# Patient Record
Sex: Female | Born: 1985 | Race: White | Hispanic: No | Marital: Single | Smoking: Current every day smoker
Health system: Southern US, Community
[De-identification: ages and names within clinical notes are randomized; demographics above are authoritative.]

## PROBLEM LIST (undated history)

## (undated) DIAGNOSIS — I10 Essential (primary) hypertension: Secondary | ICD-10-CM

---

## 2011-03-23 ENCOUNTER — Emergency Department (INDEPENDENT_AMBULATORY_CARE_PROVIDER_SITE_OTHER)
Admission: EM | Admit: 2011-03-23 | Discharge: 2011-03-23 | Disposition: A | Discharge status: Home / Self Care | Payer: Self-pay | Source: Home / Self Care | Attending: Family Medicine | Admitting: Family Medicine

## 2011-03-23 DIAGNOSIS — R030 Elevated blood-pressure reading, without diagnosis of hypertension: Secondary | ICD-10-CM

## 2011-03-23 MED ORDER — HYDROCHLOROTHIAZIDE 25 MG PO TABS: 25.0000 mg | ORAL_TABLET | Freq: Every day | ORAL | Status: DC

## 2011-03-23 NOTE — ED Provider Notes (Signed)
History     CSN: [id number]  Arrival date & time 03/23/11  1325   First MD Initiated Contact with Patient 03/23/11 1459      Chief Complaint  Patient presents with  . Hypertension    (Consider location/radiation/quality/duration/timing/severity/associated sxs/prior treatment) HPI Comments: Beverly Greene presents for evaluation of elevated blood pressures, taken at local pharmacies. She reports concern about her health since her mother just died in 10/30/22 from heart disease. She also reports a long hx of anxiety, without a formal diagnosis. She smokes a pack per day of cigarettes, drinks alcohol occasionally. She reports that she has changed her diet. She expresses significant anxiety and stress over her mother's death, her dad is now with end-stage renal disease, and she recently lost her job at [employer].   Patient is a 26 y.o. female presenting with hypertension. The history is provided by the patient.  Hypertension This is a new problem. The problem has not changed since onset.Pertinent negatives include no chest pain and no headaches. The symptoms are aggravated by nothing. The symptoms are relieved by nothing.    Past Medical History  Diagnosis Date  . Asthma     History reviewed. No pertinent past surgical history.  History reviewed. No pertinent family history.  History  Substance Use Topics  . Smoking status: Current Everyday Smoker  . Smokeless tobacco: Not on file  . Alcohol Use: Yes    OB History    Grav Para Term Preterm Abortions TAB SAB Ect Mult Living                  Review of Systems  Constitutional: Negative.   HENT: Negative.   Eyes: Negative.   Respiratory: Negative.   Cardiovascular: Negative.  Negative for chest pain.  Gastrointestinal: Negative.   Genitourinary: Negative.   Musculoskeletal: Negative.   Skin: Negative.   Neurological: Negative.  Negative for headaches.    Allergies  Review of patient's allergies indicates no known  allergies.  Home Medications   Current Outpatient Rx  Name Route Sig Dispense Refill  . HYDROCHLOROTHIAZIDE 25 MG PO TABS Oral Take 1 tablet (25 mg total) by mouth daily. 30 tablet 1    BP 156/108  Pulse 120  Temp(Src) 98 F (36.7 C) (Oral)  Resp 22  SpO2 100%  LMP 03/03/2011  Physical Exam  Nursing note and vitals reviewed. Constitutional: She is oriented to person, place, and time. She appears well-developed and well-nourished.  HENT:  Head: Normocephalic and atraumatic.  Eyes: EOM are normal. Pupils are equal, round, and reactive to light.  Neck: Normal range of motion.  Cardiovascular: Normal rate, regular rhythm and normal heart sounds.   No murmur heard. Pulmonary/Chest: Effort normal and breath sounds normal.  Musculoskeletal: Normal range of motion.  Neurological: She is alert and oriented to person, place, and time.  Skin: Skin is warm and dry.  Psychiatric: Her behavior is normal.    ED Course  Procedures (including critical care time)  Labs Reviewed - No data to display No results found.   1. Elevated blood pressure reading without diagnosis of hypertension       MDM  Started on HCTZ; follow up in one week        Richardo Priest, MD 03/26/11 1157

## 2011-03-23 NOTE — ED Notes (Signed)
Reportedly has had elevated BP for some time; attempted to regulate by way of diet and garlic

## 2012-07-31 ENCOUNTER — Encounter: Payer: Self-pay | Admitting: Family Medicine

## 2012-07-31 ENCOUNTER — Ambulatory Visit: Payer: Self-pay | Attending: Family Medicine | Admitting: Family Medicine

## 2012-07-31 VITALS — BP 137/85 | HR 66 | Temp 98.2°F | Ht 63.0 in | Wt 178.0 lb

## 2012-07-31 DIAGNOSIS — J45909 Unspecified asthma, uncomplicated: Secondary | ICD-10-CM | POA: Insufficient documentation

## 2012-07-31 DIAGNOSIS — I10 Essential (primary) hypertension: Secondary | ICD-10-CM | POA: Insufficient documentation

## 2012-07-31 DIAGNOSIS — F17200 Nicotine dependence, unspecified, uncomplicated: Secondary | ICD-10-CM | POA: Insufficient documentation

## 2012-07-31 LAB — LIPID PANEL
Cholesterol: 136 mg/dL (ref 0–200)
HDL: 37 mg/dL — ABNORMAL LOW (ref >39)
Total CHOL/HDL Ratio: 3.7 Ratio
Triglycerides: 346 mg/dL — ABNORMAL HIGH (ref <150)
VLDL: 69 mg/dL — ABNORMAL HIGH (ref 0–40)

## 2012-07-31 LAB — COMPREHENSIVE METABOLIC PANEL
Alkaline Phosphatase: 42 U/L (ref 39–117)
CO2: 27 mEq/L (ref 19–32)
Creat: 0.68 mg/dL (ref 0.50–1.10)
Glucose, Bld: 143 mg/dL — ABNORMAL HIGH (ref 70–99)
Total Bilirubin: 0.4 mg/dL (ref 0.3–1.2)

## 2012-07-31 LAB — CBC
Hemoglobin: 14.1 g/dL (ref 12.0–15.0)
MCH: 30.8 pg (ref 26.0–34.0)
MCHC: 34.7 g/dL (ref 30.0–36.0)
MCV: 88.6 fL (ref 78.0–100.0)
RBC: 4.58 MIL/uL (ref 3.87–5.11)

## 2012-07-31 NOTE — Patient Instructions (Addendum)
Smoking Cessation, Tips for Success YOU CAN QUIT SMOKING If you are ready to quit smoking, congratulations! You have chosen to help yourself be healthier. Cigarettes bring nicotine, tar, carbon monoxide, and other irritants into your body. Your lungs, heart, and blood vessels will be able to work better without these poisons. There are many different ways to quit smoking. Nicotine gum, nicotine patches, a nicotine inhaler, or nicotine nasal spray can help with physical craving. Hypnosis, support groups, and medicines help break the habit of smoking. Here are some tips to help you quit for good.  Throw away all cigarettes.  Clean and remove all ashtrays from your home, work, and car.  On a card, write down your reasons for quitting. Carry the card with you and read it when you get the urge to smoke.  Cleanse your body of nicotine. Drink enough water and fluids to keep your urine clear or pale yellow. Do this after quitting to flush the nicotine from your body.  Learn to predict your moods. Do not let a bad situation be your excuse to have a cigarette. Some situations in your life might tempt you into wanting a cigarette.  Never have "just one" cigarette. It leads to wanting another and another. Remind yourself of your decision to quit.  Change habits associated with smoking. If you smoked while driving or when feeling stressed, try other activities to replace smoking. Stand up when drinking your coffee. Brush your teeth after eating. Sit in a different chair when you read the paper. Avoid alcohol while trying to quit, and try to drink fewer caffeinated beverages. Alcohol and caffeine may urge you to smoke.  Avoid foods and drinks that can trigger a desire to smoke, such as sugary or spicy foods and alcohol.  Ask people who smoke not to smoke around you.  Have something planned to do right after eating or having a cup of coffee. Take a walk or exercise to perk you up. This will help to keep you  from overeating.  Try a relaxation exercise to calm you down and decrease your stress. Remember, you may be tense and nervous for the first 2 weeks after you quit, but this will pass.  Find new activities to keep your hands busy. Play with a pen, coin, or rubber band. Doodle or draw things on paper.  Brush your teeth right after eating. This will help cut down on the craving for the taste of tobacco after meals. You can try mouthwash, too.  Use oral substitutes, such as lemon drops, carrots, a cinnamon stick, or chewing gum, in place of cigarettes. Keep them handy so they are available when you have the urge to smoke.  When you have the urge to smoke, try deep breathing.  Designate your home as a nonsmoking area.  If you are a heavy smoker, ask your caregiver about a prescription for nicotine chewing gum. It can ease your withdrawal from nicotine.  Reward yourself. Set aside the cigarette money you save and buy yourself something nice.  Look for support from others. Join a support group or smoking cessation program. Ask someone at home or at work to help you with your plan to quit smoking.  Always ask yourself, "Do I need this cigarette or is this just a reflex?" Tell yourself, "Today, I choose not to smoke," or "I do not want to smoke." You are reminding yourself of your decision to quit, even if you do smoke a cigarette. HOW WILL I FEEL WHEN   I QUIT SMOKING?  The benefits of not smoking start within days of quitting.  You may have symptoms of withdrawal because your body is used to nicotine (the addictive substance in cigarettes). You may crave cigarettes, be irritable, feel very hungry, cough often, get headaches, or have difficulty concentrating.  The withdrawal symptoms are only temporary. They are strongest when you first quit but will go away within 10 to 14 days.  When withdrawal symptoms occur, stay in control. Think about your reasons for quitting. Remind yourself that these are  signs that your body is healing and getting used to being without cigarettes.  Remember that withdrawal symptoms are easier to treat than the major diseases that smoking can cause.  Even after the withdrawal is over, expect periodic urges to smoke. However, these cravings are generally short-lived and will go away whether you smoke or not. Do not smoke!  If you relapse and smoke again, do not lose hope. Most smokers quit 3 times before they are successful.  If you relapse, do not give up! Plan ahead and think about what you will do the next time you get the urge to smoke. LIFE AS A NONSMOKER: MAKE IT FOR A MONTH, MAKE IT FOR LIFE Day 1: Hang this page where you will see it every day. Day 2: Get rid of all ashtrays, matches, and lighters. Day 3: Drink water. Breathe deeply between sips. Day 4: Avoid places with smoke-filled air, such as bars, clubs, or the smoking section of restaurants. Day 5: Keep track of how much money you save by not smoking. Day 6: Avoid boredom. Keep a good book with you or go to the movies. Day 7: Reward yourself! One week without smoking! Day 8: Make a dental appointment to get your teeth cleaned. Day 9: Decide how you will turn down a cigarette before it is offered to you. Day 10: Review your reasons for quitting. Day 11: Distract yourself. Stay active to keep your mind off smoking and to relieve tension. Take a walk, exercise, read a book, do a crossword puzzle, or try a new hobby. Day 12: Exercise. Get off the bus before your stop or use stairs instead of escalators. Day 13: Call on friends for support and encouragement. Day 14: Reward yourself! Two weeks without smoking! Day 15: Practice deep breathing exercises. Day 16: Bet a friend that you can stay a nonsmoker. Day 17: Ask to sit in nonsmoking sections of restaurants. Day 18: Hang up "No Smoking" signs. Day 19: Think of yourself as a nonsmoker. Day 20: Each morning, tell yourself you will not smoke. Day  21: Reward yourself! Three weeks without smoking! Day 22: Think of smoking in negative ways. Remember how it stains your teeth, gives you bad breath, and leaves you short of breath. Day 23: Eat a nutritious breakfast. Day 24:Do not relive your days as a smoker. Day 25: Hold a pencil in your hand when talking on the telephone. Day 26: Tell all your friends you do not smoke. Day 27: Think about how much better food tastes. Day 28: Remember, one cigarette is one too many. Day 29: Take up a hobby that will keep your hands busy. Day 30: Congratulations! One month without smoking! Give yourself a big reward. Your caregiver can direct you to community resources or hospitals for support, which may include:  Group support.  Education.  Hypnosis.  Subliminal therapy. Document Released: 12/02/2003 Document Revised: 05/28/2011 Document Reviewed: 12/20/2008 [facility] Patient Information 2013 [place], [place]. Smoking  Cessation Quitting smoking is important to your health and has many advantages. However, it is not always easy to quit since nicotine is a very addictive drug. Often times, people try 3 times or more before being able to quit. This document explains the best ways for you to prepare to quit smoking. Quitting takes hard work and a lot of effort, but you can do it. ADVANTAGES OF QUITTING SMOKING  You will live longer, feel better, and live better.  Your body will feel the impact of quitting smoking almost immediately.  Within 20 minutes, blood pressure decreases. Your pulse returns to its normal level.  After 8 hours, carbon monoxide levels in the blood return to normal. Your oxygen level increases.  After 24 hours, the chance of having a heart attack starts to decrease. Your breath, hair, and body stop smelling like smoke.  After 48 hours, damaged nerve endings begin to recover. Your sense of taste and smell improve.  After 72 hours, the body is virtually free of nicotine. Your  bronchial tubes relax and breathing becomes easier.  After 2 to 12 weeks, lungs can hold more air. Exercise becomes easier and circulation improves.  The risk of having a heart attack, stroke, cancer, or lung disease is greatly reduced.  After 1 year, the risk of coronary heart disease is cut in half.  After 5 years, the risk of stroke falls to the same as a nonsmoker.  After 10 years, the risk of lung cancer is cut in half and the risk of other cancers decreases significantly.  After 15 years, the risk of coronary heart disease drops, usually to the level of a nonsmoker.  If you are pregnant, quitting smoking will improve your chances of having a healthy baby.  The people you live with, especially any children, will be healthier.  You will have extra money to spend on things other than cigarettes. QUESTIONS TO THINK ABOUT BEFORE ATTEMPTING TO QUIT You may want to talk about your answers with your caregiver.  Why do you want to quit?  If you tried to quit in the past, what helped and what did not?  What will be the most difficult situations for you after you quit? How will you plan to handle them?  Who can help you through the tough times? Your family? Friends? A caregiver?  What pleasures do you get from smoking? What ways can you still get pleasure if you quit? Here are some questions to ask your caregiver:  How can you help me to be successful at quitting?  What medicine do you think would be best for me and how should I take it?  What should I do if I need more help?  What is smoking withdrawal like? How can I get information on withdrawal? GET READY  Set a quit date.  Change your environment by getting rid of all cigarettes, ashtrays, matches, and lighters in your home, car, or work. Do not let people smoke in your home.  Review your past attempts to quit. Think about what worked and what did not. GET SUPPORT AND ENCOURAGEMENT You have a better chance of being  successful if you have help. You can get support in many ways.  Tell your family, friends, and co-workers that you are going to quit and need their support. Ask them not to smoke around you.  Get individual, group, or telephone counseling and support. Programs are available at [employer] and health centers. Call your local health department for  information about programs in your area.  Spiritual beliefs and practices may help some smokers quit.  Download a "quit meter" on your computer to keep track of quit statistics, such as how long you have gone without smoking, cigarettes not smoked, and money saved.  Get a self-help book about quitting smoking and staying off of tobacco. LEARN NEW SKILLS AND BEHAVIORS  Distract yourself from urges to smoke. Talk to someone, go for a walk, or occupy your time with a task.  Change your normal routine. Take a different route to work. Drink tea instead of coffee. Eat breakfast in a different place.  Reduce your stress. Take a hot bath, exercise, or read a book.  Plan something enjoyable to do every day. Reward yourself for not smoking.  Explore interactive web-based programs that specialize in helping you quit. GET MEDICINE AND USE IT CORRECTLY Medicines can help you stop smoking and decrease the urge to smoke. Combining medicine with the above behavioral methods and support can greatly increase your chances of successfully quitting smoking.  Nicotine replacement therapy helps deliver nicotine to your body without the negative effects and risks of smoking. Nicotine replacement therapy includes nicotine gum, lozenges, inhalers, nasal sprays, and skin patches. Some may be available over-the-counter and others require a prescription.  Antidepressant medicine helps people abstain from smoking, but how this works is unknown. This medicine is available by prescription.  Nicotinic receptor partial agonist medicine simulates the effect of nicotine in your  brain. This medicine is available by prescription. Ask your caregiver for advice about which medicines to use and how to use them based on your health history. Your caregiver will tell you what side effects to look out for if you choose to be on a medicine or therapy. Carefully read the information on the package. Do not use any other product containing nicotine while using a nicotine replacement product.  RELAPSE OR DIFFICULT SITUATIONS Most relapses occur within the first 3 months after quitting. Do not be discouraged if you start smoking again. Remember, most people try several times before finally quitting. You may have symptoms of withdrawal because your body is used to nicotine. You may crave cigarettes, be irritable, feel very hungry, cough often, get headaches, or have difficulty concentrating. The withdrawal symptoms are only temporary. They are strongest when you first quit, but they will go away within 10 14 days. To reduce the chances of relapse, try to:  Avoid drinking alcohol. Drinking lowers your chances of successfully quitting.  Reduce the amount of caffeine you consume. Once you quit smoking, the amount of caffeine in your body increases and can give you symptoms, such as a rapid heartbeat, sweating, and anxiety.  Avoid smokers because they can make you want to smoke.  Do not let weight gain distract you. Many smokers will gain weight when they quit, usually less than 10 pounds. Eat a healthy diet and stay active. You can always lose the weight gained after you quit.  Find ways to improve your mood other than smoking. FOR MORE INFORMATION  www.smokefree.gov  Document Released: 02/27/2001 Document Revised: 09/04/2011 Document Reviewed: 06/14/2011 [facility] Patient Information 2013 [place], [place]. Hypertension Hypertension is another name for high blood pressure. High blood pressure may mean that your heart needs to work harder to pump blood. Blood pressure consists of two  numbers, which includes a higher number over a lower number (example: 110/72). HOME CARE   Make lifestyle changes as told by your doctor. This may include weight loss  and exercise.  Take your blood pressure medicine every day.  Limit how much salt you use.  Stop smoking if you smoke.  Do not use drugs.  Talk to your doctor if you are using decongestants or birth control pills. These medicines might make blood pressure higher.  Females should not drink more than 1 alcoholic drink per day. Males should not drink more than 2 alcoholic drinks per day.  See your doctor as told. GET HELP RIGHT AWAY IF:   You have a blood pressure reading with a top number of 180 or higher.  You get a very bad headache.  You get blurred or changing vision.  You feel confused.  You feel weak, numb, or faint.  You get chest or belly (abdominal) pain.  You throw up (vomit).  You cannot breathe very well. MAKE SURE YOU:   Understand these instructions.  Will watch your condition.  Will get help right away if you are not doing well or get worse. Document Released: 08/22/2007 Document Revised: 05/28/2011 Document Reviewed: 08/22/2007 [facility] Patient Information 2013 [place], [place].

## 2012-07-31 NOTE — Progress Notes (Signed)
Quick Note:  Please inform patient that labs came back OK. Recheck in 6 months.   Rodney Langton, MD, CDE, FAAFP [facility] [place], [place]   ______

## 2012-07-31 NOTE — Progress Notes (Signed)
Patient ID: Beverly Greene, female   DOB: 12/05/1985, 27 y.o.   MRN: [id number] CC: establish  HPI: Pt has been doing well, has history of hypertension controlled with diet and exercise and childhood asthma.  No complaints today. No blood work in last few years.   No Known Allergies Past Medical History  Diagnosis Date  . Asthma    Current Outpatient Prescriptions on File Prior to Visit  Medication Sig Dispense Refill  . hydrochlorothiazide (HYDRODIURIL) 25 MG tablet Take 1 tablet (25 mg total) by mouth daily.  30 tablet  1   No current facility-administered medications on file prior to visit.   Family History  Problem Relation Age of Onset  . Heart disease Mother   . Kidney disease Father   . Diabetes Maternal Uncle    History   Social History  . Marital Status: Single    Spouse Name: N/A    Number of Children: N/A  . Years of Education: N/A   Occupational History  . Not on file.   Social History Main Topics  . Smoking status: Current Every Day Smoker -- 1.00 packs/day    Types: Cigarettes  . Smokeless tobacco: Never Used  . Alcohol Use: Yes  . Drug Use: No  . Sexually Active: Yes   Other Topics Concern  . Not on file   Social History Narrative  . No narrative on file    Review of Systems  Constitutional: Negative for fever, chills, diaphoresis, activity change, appetite change and fatigue.  HENT: Negative for ear pain, nosebleeds, congestion, facial swelling, rhinorrhea, neck pain, neck stiffness and ear discharge.   Eyes: Negative for pain, discharge, redness, itching and visual disturbance.  Respiratory: Negative for cough, choking, chest tightness, shortness of breath, wheezing and stridor.   Cardiovascular: Negative for chest pain, palpitations and leg swelling.  Gastrointestinal: Negative for abdominal distention.  Genitourinary: Negative for dysuria, urgency, frequency, hematuria, flank pain, decreased urine volume, difficulty urinating and dyspareunia.   Musculoskeletal: Negative for back pain, joint swelling, arthralgias and gait problem.  Neurological: Negative for dizziness, tremors, seizures, syncope, facial asymmetry, speech difficulty, weakness, light-headedness, numbness and headaches.  Hematological: Negative for adenopathy. Does not bruise/bleed easily.  Psychiatric/Behavioral: Negative for hallucinations, behavioral problems, confusion, dysphoric mood, decreased concentration and agitation.    Objective:   Filed Vitals:   07/31/12 1122  BP: 137/85  Pulse: 66  Temp: 98.2 F (36.8 C)    Physical Exam  Constitutional: Appears well-developed and well-nourished. No distress.  HENT: Normocephalic. External right and left ear normal. Oropharynx is clear and moist.  Eyes: Conjunctivae and EOM are normal. PERRLA, no scleral icterus.  Neck: Normal ROM. Neck supple. No JVD. No tracheal deviation. No thyromegaly.  CVS: RRR, S1/S2 +, no murmurs, no gallops, no carotid bruit.  Pulmonary: Effort and breath sounds normal, no stridor, rhonchi, wheezes, rales.  Abdominal: Soft. BS +,  no distension, tenderness, rebound or guarding.  Musculoskeletal: Normal range of motion. No edema and no tenderness.  Lymphadenopathy: No lymphadenopathy noted, cervical, inguinal. Neuro: Alert. Normal reflexes, muscle tone coordination. No cranial nerve deficit. Skin: Skin is warm and dry. No rash noted. Not diaphoretic. No erythema. No pallor.  Psychiatric: Normal mood and affect. Behavior, judgment, thought content normal.   No results found for this basename: WBC, HGB, HCT, MCV, PLT   No results found for this basename: CREATININE, BUN, NA, K, CL, CO2    No results found for this basename: HGBA1C      Assessment:  Hypertension, diet controlled Asthma, controlled mild intermittent Tobacco       Plan:    Unspecified essential hypertension - Plan: CBC, Comprehensive metabolic panel, Lipid Panel, Hemoglobin A1c  Tobacco use disorder - Plan:  CBC, Comprehensive metabolic panel, Lipid Panel, Hemoglobin A1c  Unspecified asthma - Plan: CBC, Comprehensive metabolic panel, Lipid Panel, Hemoglobin A1c   The patient was counseled on the dangers of tobacco use, and was advised to quit.  Reviewed strategies to maximize success, including removing cigarettes and smoking materials from environment and stress management.  Check labs today Follow results  The patient was given clear instructions to go to ER or return to medical center if symptoms don't improve, worsen or new problems develop.  The patient verbalized understanding.  The patient was told to call to get lab results if they haven't heard anything in the next week.    Follow up scheduled  Rodney Langton, MD, CDE, FAAFP [facility] [place], [place]

## 2012-08-01 ENCOUNTER — Telehealth: Payer: Self-pay

## 2012-08-01 DIAGNOSIS — J45909 Unspecified asthma, uncomplicated: Secondary | ICD-10-CM | POA: Insufficient documentation

## 2012-08-01 DIAGNOSIS — F17200 Nicotine dependence, unspecified, uncomplicated: Secondary | ICD-10-CM | POA: Insufficient documentation

## 2012-08-01 DIAGNOSIS — I10 Essential (primary) hypertension: Secondary | ICD-10-CM | POA: Insufficient documentation

## 2012-08-01 NOTE — Telephone Encounter (Signed)
Patient not available- left message on machine to return call

## 2012-12-08 ENCOUNTER — Emergency Department (HOSPITAL_COMMUNITY): Payer: Self-pay

## 2012-12-08 ENCOUNTER — Inpatient Hospital Stay (HOSPITAL_COMMUNITY)
Admission: EM | Admit: 2012-12-08 | Discharge: 2012-12-11 | DRG: 432 | Discharge status: Against Medical Advice | Payer: Self-pay | Attending: Internal Medicine | Admitting: Internal Medicine

## 2012-12-08 ENCOUNTER — Encounter (HOSPITAL_COMMUNITY): Payer: Self-pay

## 2012-12-08 DIAGNOSIS — F10239 Alcohol dependence with withdrawal, unspecified: Secondary | ICD-10-CM | POA: Diagnosis present

## 2012-12-08 DIAGNOSIS — F17200 Nicotine dependence, unspecified, uncomplicated: Secondary | ICD-10-CM | POA: Diagnosis present

## 2012-12-08 DIAGNOSIS — F10229 Alcohol dependence with intoxication, unspecified: Secondary | ICD-10-CM | POA: Diagnosis present

## 2012-12-08 DIAGNOSIS — F411 Generalized anxiety disorder: Secondary | ICD-10-CM | POA: Diagnosis present

## 2012-12-08 DIAGNOSIS — I498 Other specified cardiac arrhythmias: Secondary | ICD-10-CM | POA: Diagnosis present

## 2012-12-08 DIAGNOSIS — D6959 Other secondary thrombocytopenia: Secondary | ICD-10-CM | POA: Diagnosis present

## 2012-12-08 DIAGNOSIS — F1020 Alcohol dependence, uncomplicated: Secondary | ICD-10-CM | POA: Diagnosis present

## 2012-12-08 DIAGNOSIS — K7010 Alcoholic hepatitis without ascites: Principal | ICD-10-CM | POA: Diagnosis present

## 2012-12-08 DIAGNOSIS — N12 Tubulo-interstitial nephritis, not specified as acute or chronic: Secondary | ICD-10-CM

## 2012-12-08 DIAGNOSIS — N83209 Unspecified ovarian cyst, unspecified side: Secondary | ICD-10-CM | POA: Diagnosis present

## 2012-12-08 DIAGNOSIS — R188 Other ascites: Secondary | ICD-10-CM | POA: Diagnosis present

## 2012-12-08 DIAGNOSIS — E871 Hypo-osmolality and hyponatremia: Secondary | ICD-10-CM | POA: Diagnosis present

## 2012-12-08 DIAGNOSIS — I10 Essential (primary) hypertension: Secondary | ICD-10-CM | POA: Diagnosis present

## 2012-12-08 DIAGNOSIS — Z79899 Other long term (current) drug therapy: Secondary | ICD-10-CM

## 2012-12-08 DIAGNOSIS — K859 Acute pancreatitis without necrosis or infection, unspecified: Secondary | ICD-10-CM | POA: Diagnosis present

## 2012-12-08 DIAGNOSIS — R569 Unspecified convulsions: Secondary | ICD-10-CM | POA: Diagnosis present

## 2012-12-08 DIAGNOSIS — N390 Urinary tract infection, site not specified: Secondary | ICD-10-CM | POA: Diagnosis present

## 2012-12-08 DIAGNOSIS — F10939 Alcohol use, unspecified with withdrawal, unspecified: Secondary | ICD-10-CM | POA: Diagnosis present

## 2012-12-08 DIAGNOSIS — E876 Hypokalemia: Secondary | ICD-10-CM | POA: Diagnosis present

## 2012-12-08 DIAGNOSIS — J45909 Unspecified asthma, uncomplicated: Secondary | ICD-10-CM | POA: Diagnosis present

## 2012-12-08 DIAGNOSIS — K661 Hemoperitoneum: Secondary | ICD-10-CM | POA: Diagnosis present

## 2012-12-08 HISTORY — DX: Essential (primary) hypertension: I10

## 2012-12-08 LAB — COMPREHENSIVE METABOLIC PANEL
Albumin: 2.6 g/dL — ABNORMAL LOW (ref 3.5–5.2)
BUN: 3 mg/dL — ABNORMAL LOW (ref 6–23)
Chloride: 73 mEq/L — ABNORMAL LOW (ref 96–112)
Creatinine, Ser: 0.48 mg/dL — ABNORMAL LOW (ref 0.50–1.10)
GFR calc non Af Amer: >90 mL/min (ref >90)
Total Bilirubin: 2 mg/dL — ABNORMAL HIGH (ref 0.3–1.2)

## 2012-12-08 LAB — PREGNANCY, URINE: Preg Test, Ur: NEGATIVE

## 2012-12-08 LAB — CBC WITH DIFFERENTIAL/PLATELET
Basophils Relative: 0 % (ref 0–1)
HCT: 36.3 % (ref 36.0–46.0)
Hemoglobin: 13 g/dL (ref 12.0–15.0)
MCH: 32 pg (ref 26.0–34.0)
MCHC: 35.8 g/dL (ref 30.0–36.0)
MCV: 89.4 fL (ref 78.0–100.0)
Monocytes Absolute: 0.5 10*3/uL (ref 0.1–1.0)
Monocytes Relative: 3 % (ref 3–12)
Neutro Abs: 14.6 10*3/uL — ABNORMAL HIGH (ref 1.7–7.7)

## 2012-12-08 LAB — BASIC METABOLIC PANEL
BUN: <3 mg/dL — ABNORMAL LOW (ref 6–23)
CO2: 27 mEq/L (ref 19–32)
Chloride: 80 mEq/L — ABNORMAL LOW (ref 96–112)
Creatinine, Ser: 0.55 mg/dL (ref 0.50–1.10)
GFR calc Af Amer: >90 mL/min (ref >90)
Glucose, Bld: 140 mg/dL — ABNORMAL HIGH (ref 70–99)
Potassium: 2.7 mEq/L — CL (ref 3.5–5.1)

## 2012-12-08 LAB — URINE MICROSCOPIC-ADD ON

## 2012-12-08 LAB — PHOSPHORUS: Phosphorus: 1.5 mg/dL — ABNORMAL LOW (ref 2.3–4.6)

## 2012-12-08 LAB — URINALYSIS, ROUTINE W REFLEX MICROSCOPIC
Glucose, UA: NEGATIVE mg/dL
Hgb urine dipstick: NEGATIVE
Ketones, ur: NEGATIVE mg/dL
pH: 6.5 (ref 5.0–8.0)

## 2012-12-08 LAB — GLUCOSE, CAPILLARY: Glucose-Capillary: 118 mg/dL — ABNORMAL HIGH (ref 70–99)

## 2012-12-08 LAB — POCT PREGNANCY, URINE: Preg Test, Ur: NEGATIVE

## 2012-12-08 LAB — PROCALCITONIN: Procalcitonin: 0.18 ng/mL

## 2012-12-08 MED ORDER — THIAMINE HCL 100 MG/ML IJ SOLN
100.0000 mg | Freq: Every day | INTRAMUSCULAR | Status: DC
  Administered 2012-12-08 – 2012-12-09 (×2): 100 mg via INTRAVENOUS
  Filled 2012-12-08 (×2): qty 1

## 2012-12-08 MED ORDER — INSULIN ASPART 100 UNIT/ML ~~LOC~~ SOLN
0.0000 [IU] | SUBCUTANEOUS | Status: DC
  Administered 2012-12-09: 2 [IU] via SUBCUTANEOUS

## 2012-12-08 MED ORDER — VITAMIN B-1 100 MG PO TABS
100.0000 mg | ORAL_TABLET | Freq: Every day | ORAL | Status: DC
  Administered 2012-12-08: 100 mg via ORAL
  Filled 2012-12-08: qty 1

## 2012-12-08 MED ORDER — LORAZEPAM 2 MG/ML IJ SOLN
1.0000 mg | Freq: Once | INTRAMUSCULAR | Status: AC
  Administered 2012-12-08: 1 mg via INTRAVENOUS

## 2012-12-08 MED ORDER — CEPHALEXIN 500 MG PO CAPS: 500.0000 mg | ORAL_CAPSULE | Freq: Two times a day (BID) | ORAL | Status: DC

## 2012-12-08 MED ORDER — POTASSIUM CHLORIDE 10 MEQ/100ML IV SOLN
10.0000 meq | INTRAVENOUS | Status: AC
  Administered 2012-12-08 – 2012-12-09 (×4): 10 meq via INTRAVENOUS
  Filled 2012-12-08 (×4): qty 100

## 2012-12-08 MED ORDER — SODIUM CHLORIDE 0.9 % IV SOLN
1.0000 g | Freq: Once | INTRAVENOUS | Status: AC
  Administered 2012-12-08: 1 g via INTRAVENOUS
  Filled 2012-12-08 (×2): qty 10

## 2012-12-08 MED ORDER — DEXTROSE 5 % IV SOLN
1.0000 g | Freq: Once | INTRAVENOUS | Status: AC
  Administered 2012-12-08: 1 g via INTRAVENOUS
  Filled 2012-12-08: qty 10

## 2012-12-08 MED ORDER — FOLIC ACID 5 MG/ML IJ SOLN
1.0000 mg | Freq: Every day | INTRAMUSCULAR | Status: DC
  Administered 2012-12-08 – 2012-12-09 (×2): 1 mg via INTRAVENOUS
  Filled 2012-12-08 (×3): qty 0.2

## 2012-12-08 MED ORDER — SODIUM CHLORIDE 3 % IV SOLN
Freq: Once | INTRAVENOUS | Status: AC
  Administered 2012-12-08: 19:00:00 via INTRAVENOUS
  Filled 2012-12-08: qty 100

## 2012-12-08 MED ORDER — DEXTROSE 5 % IV SOLN
1.0000 g | Freq: Every day | INTRAVENOUS | Status: DC
  Administered 2012-12-08: 23:00:00 via INTRAVENOUS
  Administered 2012-12-09 – 2012-12-10 (×2): 1 g via INTRAVENOUS
  Filled 2012-12-08 (×4): qty 10

## 2012-12-08 MED ORDER — FOLIC ACID 1 MG PO TABS
1.0000 mg | ORAL_TABLET | Freq: Every day | ORAL | Status: DC
  Administered 2012-12-08: 1 mg via ORAL
  Filled 2012-12-08: qty 1

## 2012-12-08 MED ORDER — THIAMINE HCL 100 MG/ML IJ SOLN: 100.0000 mg | Freq: Every day | INTRAMUSCULAR | Status: DC

## 2012-12-08 MED ORDER — SODIUM CHLORIDE 0.9 % IV SOLN
INTRAVENOUS | Status: DC
  Administered 2012-12-08: 1000 mL via INTRAVENOUS

## 2012-12-08 MED ORDER — LORAZEPAM 2 MG/ML IJ SOLN: 1.0000 mg | Freq: Four times a day (QID) | INTRAMUSCULAR | Status: DC | PRN

## 2012-12-08 MED ORDER — LORAZEPAM 2 MG/ML IJ SOLN
INTRAMUSCULAR | Status: AC
  Administered 2012-12-08: 1 mg via INTRAVENOUS
  Filled 2012-12-08: qty 3

## 2012-12-08 MED ORDER — ADULT MULTIVITAMIN W/MINERALS CH
1.0000 | ORAL_TABLET | Freq: Every day | ORAL | Status: DC
  Administered 2012-12-08: 1 via ORAL
  Filled 2012-12-08: qty 1

## 2012-12-08 MED ORDER — LORAZEPAM 2 MG/ML IJ SOLN
1.0000 mg | INTRAMUSCULAR | Status: DC | PRN
  Administered 2012-12-09 (×2): 1 mg via INTRAVENOUS
  Administered 2012-12-10: 2 mg via INTRAVENOUS
  Filled 2012-12-08 (×3): qty 1

## 2012-12-08 MED ORDER — LORAZEPAM 1 MG PO TABS: 1.0000 mg | ORAL_TABLET | Freq: Four times a day (QID) | ORAL | Status: DC | PRN

## 2012-12-08 MED ORDER — ONDANSETRON 8 MG PO TBDP
8.0000 mg | ORAL_TABLET | Freq: Once | ORAL | Status: AC
  Administered 2012-12-08: 8 mg via ORAL
  Filled 2012-12-08: qty 1

## 2012-12-08 NOTE — ED Notes (Signed)
Pt denies SI/HI 

## 2012-12-08 NOTE — ED Provider Notes (Signed)
CSN: [id number]     Arrival date & time 12/08/12  1412 History   First MD Initiated Contact with Patient 12/08/12 1515     Chief Complaint  Patient presents with  . Medical Clearance  . Abdominal Pain  . Constipation   (Consider location/radiation/quality/duration/timing/severity/associated sxs/prior Treatment) HPI Comments: PT comes in with cc of abd pain, constipation and detox. Pt has no significant medical hx. States that she has been drinking heavily the last 2 months, after struggling with alcoholism over the past few years. She drinks at least a 6 pack of beer daily. She realized that it was getting harder for her to control her drinking, and so comes to the ED seeking detox. Never required detox in the past. Pt also complains of abd pain - diffuse, periumbilical and right sided, going towards the back. She has decreased urine output, and it hurts when she attempts to urinate. No hematuria. No hx of STD, and no vaginal discharge or bleeding. Pt does admit to having unprotected intercourse with 2 partners, LMP was earlier this month.  Patient is a 27 y.o. female presenting with abdominal pain and constipation. The history is provided by the patient.  Abdominal Pain Associated symptoms: constipation and fatigue   Associated symptoms: no chest pain, no cough, no diarrhea, no fever, no hematuria, no nausea, no shortness of breath and no vomiting   Constipation Associated symptoms: abdominal pain   Associated symptoms: no diarrhea, no fever, no nausea and no vomiting     Past Medical History  Diagnosis Date  . Asthma   . Hypertension    History reviewed. No pertinent past surgical history. Family History  Problem Relation Age of Onset  . Heart disease Mother   . Kidney disease Father   . Diabetes Maternal Uncle    History  Substance Use Topics  . Smoking status: Current Every Day Smoker -- 1.00 packs/day    Types: Cigarettes  . Smokeless tobacco: Never Used  . Alcohol Use:  Yes   OB History   Grav Para Term Preterm Abortions TAB SAB Ect Mult Living                 Review of Systems  Constitutional: Positive for fatigue. Negative for fever and activity change.  HENT: Negative for facial swelling and neck pain.   Respiratory: Negative for cough, shortness of breath and wheezing.   Cardiovascular: Negative for chest pain.  Gastrointestinal: Positive for abdominal pain and constipation. Negative for nausea, vomiting, diarrhea, blood in stool and abdominal distention.  Genitourinary: Negative for hematuria and difficulty urinating.  Skin: Negative for color change and rash.  Neurological: Negative for speech difficulty.  Hematological: Does not bruise/bleed easily.  Psychiatric/Behavioral: Positive for sleep disturbance. Negative for suicidal ideas, confusion and self-injury.    Allergies  Review of patient's allergies indicates no known allergies.  Home Medications   Current Outpatient Rx  Name  Route  Sig  Dispense  Refill  . b complex-C-folic acid 1 MG capsule   Oral   Take 1 capsule by mouth daily.         . Melatonin 3 MG TABS   Oral   Take 3 mg by mouth daily.         . methylcellulose (ARTIFICIAL TEARS) 1 % ophthalmic solution   Both Eyes   Place 1 drop into both eyes 2 (two) times daily as needed (dry eyes).         Gerarda Fraction Root 450 MG CAPS  Oral   Take 450 mg by mouth at bedtime.          BP 111/59  Pulse 130  Temp(Src) 98 F (36.7 C)  Resp 18  SpO2 96%  LMP 11/17/2012 Physical Exam  Nursing note and vitals reviewed. Constitutional: She is oriented to person, place, and time. She appears well-developed and well-nourished.  HENT:  Head: Normocephalic and atraumatic.  Eyes: EOM are normal. Pupils are equal, round, and reactive to light.  Neck: Neck supple.  Cardiovascular: Normal rate, regular rhythm and normal heart sounds.   No murmur heard. Pulmonary/Chest: Effort normal. No respiratory distress.  Abdominal:  Soft. She exhibits no distension and no mass. There is tenderness. There is no rebound and no guarding.  Diffuse tenderness, periumbilical, RLQ and right flank, no rebound or guarding.  Neurological: She is alert and oriented to person, place, and time.  Skin: Skin is warm and dry.    ED Course  Procedures (including critical care time) Labs Review Labs Reviewed  URINALYSIS, ROUTINE W REFLEX MICROSCOPIC - Abnormal; Notable for the following:    Color, Urine ORANGE (*)    APPearance CLOUDY (*)    Bilirubin Urine SMALL (*)    Urobilinogen, UA 2.0 (*)    Nitrite POSITIVE (*)    Leukocytes, UA SMALL (*)    All other components within normal limits  CBC WITH DIFFERENTIAL - Abnormal; Notable for the following:    WBC 16.2 (*)    Platelets 143 (*)    Neutrophils Relative % 90 (*)    Neutro Abs 14.6 (*)    Lymphocytes Relative 7 (*)    All other components within normal limits  COMPREHENSIVE METABOLIC PANEL - Abnormal; Notable for the following:    Sodium 116 (*)    Potassium 3.1 (*)    Chloride 73 (*)    Glucose, Bld 160 (*)    BUN 3 (*)    Creatinine, Ser 0.48 (*)    Calcium 6.5 (*)    Total Protein 5.8 (*)    Albumin 2.6 (*)    AST 74 (*)    ALT 67 (*)    Total Bilirubin 2.0 (*)    All other components within normal limits  URINE MICROSCOPIC-ADD ON - Abnormal; Notable for the following:    Squamous Epithelial / LPF FEW (*)    Bacteria, UA MANY (*)    All other components within normal limits  GC/CHLAMYDIA PROBE AMP  WET PREP, GENITAL  URINE CULTURE  PREGNANCY, URINE  ETHANOL  OSMOLALITY  POCT PREGNANCY, URINE   Imaging Review No results found.  MDM   1. Alcoholism /alcohol abuse    Pt comes in with cc of abd pain and alcohol detox request.  DDx includes: Pancreatitis Hepatobiliary pathology including cholecystitis Gastritis/PUD SBO Appendicitis Hernia Nephrolithiasis Pyelonephritis UTI/Cystitis Ovarian cyst TOA Ectopic  pregnancy PID STD  Substance abuse Acute withdrawal  Initial impression was that patient has alcoholism, likely some e'lyte abd and pyelonephritis - and we wanted to ensure there was no PID, TOA.  While awaiting the workup, patient had an episode of seizure, generalized tonic, clonic. Seizure precautions were undertaken, and iv established, 1 mg ativan given, as her seizure had stopped by the time IV was established.  Lab results came in, and her Sodium is 116. 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  CT head ordered, and CCM called for admission. Hypertonic 3 %  NS 100 ml over 10 minutes ordered. Deferring hyponatremia management to CCM.  CRITICAL CARE Performed by: Derwood Kaplan   Total critical care time: 45 minutes  Critical care time was exclusive of separately billable procedures and treating other patients.  Critical care was necessary to treat or prevent imminent or life-threatening deterioration.  Critical care was time spent personally by me on the following activities: development of treatment plan with patient and/or surrogate as well as nursing, discussions with consultants, evaluation of patient's response to treatment, examination of patient, obtaining history from patient or surrogate, ordering and performing treatments and interventions, ordering and review of laboratory studies, ordering and review of radiographic studies, pulse oximetry and re-evaluation of patient's condition.   Derwood Kaplan, MD 12/08/12 [phone]

## 2012-12-08 NOTE — ED Notes (Signed)
MD at bedside. 

## 2012-12-08 NOTE — H&P (Signed)
PULMONARY  / CRITICAL CARE MEDICINE  Name: Beverly Greene MRN: [id number] DOB: Nov 29, 1985    ADMISSION DATE:  12/08/2012 CONSULTATION DATE:  12/08/2012  REFERRING MD :  EDP PRIMARY SERVICE:  PCCM  CHIEF COMPLAINT:  Seizure  BRIEF PATIENT DESCRIPTION: 27 yo with past medical history of alcohol abuse (6-12 beers daily, last drink the day before admission) brought to ED with abdominal pain, nausea and vomiting x 2 days.  Also reported fatigue and constipation.  Denied fever, cough, dysuria, hematuria, frequency or urgency.  Denied vaginal discharge.  In ED developed developed tonic-clonic seizure, treated with Ativan, Na noted to be 116, started on hypertonic saline bolus.  PCCM was consulted.  SIGNIFICANT EVENTS / STUDIES:  9/22  Head CT >>> nad 9/22  Abdomen US >>> Hepatic steatosis, hepatomegaly, small amount of perihepatic ascites 9/22  Pelvis / transvaginal US >>>  Small amount of free fluid compatible with hemoperitoneum (likley secondary to ruptured abdominal pain) - that was discussed with Dr. Carlota Raspberry (Radiology) - no further studies are recommended.   LINES / TUBES:  CULTURES: 9/22 Urine >>>  ANTIBIOTICS: Ceftriaxone 9/22 >>>  HISTORY OF PRESENT ILLNESS:  27 yo with past medical history of alcohol abuse (6-12 beers daily, last drink the day before admission) brought to ED with abdominal pain, nausea and vomiting x 2 days. Developed developed tonic-clonic seizure, treated with Ativan, Na noted to be 116, started on hypertonic saline bolus.  PCCM was consulted.  PAST MEDICAL HISTORY :  Past Medical History  Diagnosis Date  . Asthma   . Hypertension    History reviewed. No pertinent past surgical history. Prior to Admission medications   Medication Sig Start Date End Date Taking? Authorizing Provider  b complex-C-folic acid 1 MG capsule Take 1 capsule by mouth daily.   Yes Historical Provider, MD  Melatonin 3 MG TABS Take 3 mg by mouth daily.   Yes Historical Provider, MD   methylcellulose (ARTIFICIAL TEARS) 1 % ophthalmic solution Place 1 drop into both eyes 2 (two) times daily as needed (dry eyes).   Yes Historical Provider, MD  Valerian Root 450 MG CAPS Take 450 mg by mouth at bedtime.   Yes Historical Provider, MD   No Known Allergies  FAMILY HISTORY:  Family History  Problem Relation Age of Onset  . Heart disease Mother   . Kidney disease Father   . Diabetes Maternal Uncle    SOCIAL HISTORY:  reports that she has been smoking Cigarettes.  She has been smoking about 1.00 pack per day. She has never used smokeless tobacco. She reports that  drinks alcohol. She reports that she does not use illicit drugs.  REVIEW OF SYSTEMS:  As per HPI.  Otherwise negative.  INTERVAL HISTORY:  VITAL SIGNS: Temp:  [98 F (36.7 C)-98.4 F (36.9 C)] 98.4 F (36.9 C) (09/22 1937) Pulse Rate:  [113-130] 130 (09/22 1853) Resp:  [18-24] 24 (09/22 1937) BP: (111-137)/(59-89) 123/83 mmHg (09/22 1937) SpO2:  [93 %-97 %] 93 % (09/22 1937)  HEMODYNAMICS:   VENTILATOR SETTINGS:   INTAKE / OUTPUT: Intake/Output   None    PHYSICAL EXAMINATION: General:  Appears acutely ill, in no respiratory distress Neuro:  Awake, alert, cooperative, protects airway HEENT:  PERRL, dry membranes, bite marks / dry blood on the tongue Cardiovascular:  RRR, no m/r/g Lungs:  Bilateral diminished air entry, no w/r/r Abdomen:  Soft, moderate periumbilical tenderness without rebound, normoactive bowelsounds Musculoskeletal:  Moves all extremities, no edema Skin:  Intact  LABS:  Recent Labs Lab 12/08/12 1745  HGB 13.0  WBC 16.2*  PLT 143*  NA 116*  K 3.1*  CL 73*  CO2 24  GLUCOSE 160*  BUN 3*  CREATININE 0.48*  CALCIUM 6.5*  AST 74*  ALT 67*  ALKPHOS 101  BILITOT 2.0*  PROT 5.8*  ALBUMIN 2.6*   No results found for this basename: GLUCAP,  in the last 168 hours  CXR:    ASSESSMENT / PLAN:  PULMONARY A:  No active issues. P:   Gaol SpO2>92 Supplemental oxygen  PRN  CARDIOVASCULAR A: Hemodynamically stable.  No arrhythmia / ischemia. P:  Cardiac monitoring  RENAL A:  Normal renal function.  Hypokalemia.  Hypocalcemia. Hyponatremia (hypo-to-euvolemic, possible beer potomania). P:   Goal Na > 120 Trend BMP q4h, next 2100 Given hypertonic saline bolus in ED NS@150  May try Lasix Free water restriction Urine / Plasma osm K 10 x 4 Ca 1 x 1  Check Mg / Phos  GASTROINTESTINAL A:  Acute alcoholic hepatitis.  Possible pancreatitis.  Possible ruptured ovarian cyst with hemoperitoneum.  Less likely SBP. P:   NPO GI Px is not indicated Trend LFT No further imaging recommended per Radiology Amylase / Lipase  HEMATOLOGIC A:  Mild thrombocytopenia, likely secondary to ETOH. P:  Trend CBC SCD for DVT Px  INFECTIOUS A:  Possible SBP.  Possible PID. Possible UTI. P:   Cultures and antibiotics as above PCT GC/Chlamydia probe  ENDOCRINE  A:  Hyperglycemia. P:   SSI  NEUROLOGIC A:  Alcohol abuse.  Seizures in setting of alcohol withdrawal / hyponatremia.  P:   Defer maintenance anticonvulsives Ativan PRN for elevated CIWA / seizures Thiamin / Folate Seizure precautions Neuro-checks  I have personally obtained history, examined patient, evaluated and interpreted laboratory and imaging results, reviewed medical records, formulated assessment / plan and placed orders.  CRITICAL CARE:  The patient is critically ill with multiple organ systems failure and requires high complexity decision making for assessment and support, frequent evaluation and titration of therapies, application of advanced monitoring technologies and extensive interpretation of multiple databases. Critical Care Time devoted to patient care services described in this note is 40 minutes.   Lonia Farber, MD Pulmonary and Critical Care Medicine [facility] Pager: [phone]  12/08/2012, 7:46 PM

## 2012-12-08 NOTE — ED Notes (Signed)
Pt requesting detox from ETOH, abdominal pain x 3days with constipation and n/v

## 2012-12-09 DIAGNOSIS — F1020 Alcohol dependence, uncomplicated: Secondary | ICD-10-CM

## 2012-12-09 LAB — HEPATIC FUNCTION PANEL
ALT: 49 U/L — ABNORMAL HIGH (ref 0–35)
AST: 55 U/L — ABNORMAL HIGH (ref 0–37)
Albumin: 2 g/dL — ABNORMAL LOW (ref 3.5–5.2)
Alkaline Phosphatase: 76 U/L (ref 39–117)
Total Bilirubin: 1.4 mg/dL — ABNORMAL HIGH (ref 0.3–1.2)
Total Protein: 4.9 g/dL — ABNORMAL LOW (ref 6.0–8.3)

## 2012-12-09 LAB — BASIC METABOLIC PANEL
BUN: <3 mg/dL — ABNORMAL LOW (ref 6–23)
BUN: <3 mg/dL — ABNORMAL LOW (ref 6–23)
CO2: 29 mEq/L (ref 19–32)
CO2: 29 mEq/L (ref 19–32)
Calcium: 6.7 mg/dL — ABNORMAL LOW (ref 8.4–10.5)
Calcium: 6.9 mg/dL — ABNORMAL LOW (ref 8.4–10.5)
Calcium: 7.8 mg/dL — ABNORMAL LOW (ref 8.4–10.5)
Chloride: 85 mEq/L — ABNORMAL LOW (ref 96–112)
Chloride: 93 mEq/L — ABNORMAL LOW (ref 96–112)
Chloride: 98 mEq/L (ref 96–112)
Creatinine, Ser: 0.57 mg/dL (ref 0.50–1.10)
Creatinine, Ser: 0.55 mg/dL (ref 0.50–1.10)
Creatinine, Ser: 0.57 mg/dL (ref 0.50–1.10)
GFR calc Af Amer: >90 mL/min (ref >90)
GFR calc non Af Amer: >90 mL/min (ref >90)
GFR calc non Af Amer: >90 mL/min (ref >90)
GFR calc non Af Amer: >90 mL/min (ref >90)
GFR calc non Af Amer: >90 mL/min (ref >90)
Glucose, Bld: 116 mg/dL — ABNORMAL HIGH (ref 70–99)
Glucose, Bld: 100 mg/dL — ABNORMAL HIGH (ref 70–99)
Glucose, Bld: 117 mg/dL — ABNORMAL HIGH (ref 70–99)
Potassium: 3.1 mEq/L — ABNORMAL LOW (ref 3.5–5.1)
Potassium: 2.9 mEq/L — ABNORMAL LOW (ref 3.5–5.1)
Sodium: 128 mEq/L — ABNORMAL LOW (ref 135–145)
Sodium: 134 mEq/L — ABNORMAL LOW (ref 135–145)

## 2012-12-09 LAB — GLUCOSE, CAPILLARY
Glucose-Capillary: 113 mg/dL — ABNORMAL HIGH (ref 70–99)
Glucose-Capillary: 106 mg/dL — ABNORMAL HIGH (ref 70–99)
Glucose-Capillary: 132 mg/dL — ABNORMAL HIGH (ref 70–99)
Glucose-Capillary: 101 mg/dL — ABNORMAL HIGH (ref 70–99)
Glucose-Capillary: 94 mg/dL (ref 70–99)

## 2012-12-09 LAB — CBC
HCT: 30.8 % — ABNORMAL LOW (ref 36.0–46.0)
MCH: 32.7 pg (ref 26.0–34.0)
MCHC: 36.4 g/dL — ABNORMAL HIGH (ref 30.0–36.0)
MCV: 89.8 fL (ref 78.0–100.0)
Platelets: 131 10*3/uL — ABNORMAL LOW (ref 150–400)
RDW: 14.4 % (ref 11.5–15.5)
WBC: 12.3 10*3/uL — ABNORMAL HIGH (ref 4.0–10.5)

## 2012-12-09 LAB — URINE CULTURE: Culture: NO GROWTH

## 2012-12-09 LAB — MAGNESIUM: Magnesium: 2 mg/dL (ref 1.5–2.5)

## 2012-12-09 MED ORDER — NICOTINE 14 MG/24HR TD PT24
14.0000 mg | MEDICATED_PATCH | Freq: Every day | TRANSDERMAL | Status: DC
  Administered 2012-12-09 – 2012-12-10 (×2): 14 mg via TRANSDERMAL
  Filled 2012-12-09 (×3): qty 1

## 2012-12-09 MED ORDER — PNEUMOCOCCAL VAC POLYVALENT 25 MCG/0.5ML IJ INJ
0.5000 mL | INJECTION | INTRAMUSCULAR | Status: DC
  Filled 2012-12-09 (×2): qty 0.5

## 2012-12-09 MED ORDER — VITAMIN B-1 100 MG PO TABS
100.0000 mg | ORAL_TABLET | Freq: Every day | ORAL | Status: DC
  Administered 2012-12-09 – 2012-12-10 (×2): 100 mg via ORAL
  Filled 2012-12-09 (×3): qty 1

## 2012-12-09 MED ORDER — POTASSIUM PHOSPHATE DIBASIC 3 MMOLE/ML IV SOLN
20.0000 mmol | Freq: Once | INTRAVENOUS | Status: AC
  Administered 2012-12-09: 20 mmol via INTRAVENOUS
  Filled 2012-12-09: qty 6.67

## 2012-12-09 MED ORDER — FOLIC ACID 1 MG PO TABS
1.0000 mg | ORAL_TABLET | Freq: Every day | ORAL | Status: DC
  Administered 2012-12-09 – 2012-12-10 (×2): 1 mg via ORAL
  Filled 2012-12-09 (×3): qty 1

## 2012-12-09 MED ORDER — POTASSIUM CHLORIDE 10 MEQ/100ML IV SOLN
10.0000 meq | INTRAVENOUS | Status: AC
  Administered 2012-12-09 (×3): 10 meq via INTRAVENOUS
  Filled 2012-12-09 (×3): qty 100

## 2012-12-09 MED ORDER — ONDANSETRON HCL 4 MG/2ML IJ SOLN: 4.0000 mg | Freq: Four times a day (QID) | INTRAMUSCULAR | Status: DC | PRN

## 2012-12-09 NOTE — Progress Notes (Signed)
CARE MANAGEMENT NOTE 12/09/2012  Patient:  Beverly Greene, Beverly Greene   Account Number:  [id number]  Date Initiated:  12/09/2012  Documentation initiated by:  DAVIS,RHONDA  Subjective/Objective Assessment:   pt with hx of etoh abuse new onset of seizures, v&n x2 days, na 113 on admit     Action/Plan:   home when stable   Anticipated DC Date:  12/12/2012   Anticipated DC Plan:  HOME/SELF CARE  In-house referral  Clinical Social Worker  Artist      DC Planning Services  NA      [facility] Choice  NA   Choice offered to / List presented to:  NA   DME arranged  NA      DME agency  NA     HH arranged  NA      HH agency  NA   Status of service:  In process, will continue to follow Medicare Important Message given?  NA - LOS <3 / Initial given by admissions (If response is "NO", the following Medicare IM given date fields will be blank) Date Medicare IM given:   Date Additional Medicare IM given:    Discharge Disposition:    Per UR Regulation:  Reviewed for med. necessity/level of care/duration of stay  If discussed at Long Length of Stay Meetings, dates discussed:    Comments:  [id number]/Rhonda Stark Jock, BSN, [place] [id number] Chart Reviewed for discharge and hospital needs. Discharge needs at time of review:  None Review of patient progress due on [id number].

## 2012-12-09 NOTE — Progress Notes (Signed)
eLink Physician-Brief Progress Note Patient Name: Beverly Greene DOB: 1985-10-17 MRN: [id number]  Date of Service  12/09/2012   HPI/Events of Note   Na just back, slightly faster than would want correction even with seizures and acute presumed, which we should NOt presume  eICU Interventions  Change saline to kvo bmet to follow May need d5w started   Intervention Category Major Interventions: Acid-Base disturbance - evaluation and management  Sajjad Honea J. 12/09/2012, 6:15 AM

## 2012-12-09 NOTE — Progress Notes (Addendum)
PULMONARY  / CRITICAL CARE MEDICINE  Name: Beverly Greene MRN: [id number] DOB: [birth year]    ADMISSION DATE:  12/20/12 CONSULTATION DATE:  12/20/2012  REFERRING MD :  EDP PRIMARY SERVICE:  PCCM  CHIEF COMPLAINT:  Seizure  BRIEF PATIENT DESCRIPTION: 27 y/o with past medical history of alcohol abuse (6-12 beers daily, last drink the day before admission) brought to ED with abdominal pain, nausea and vomiting x 2 days.  Also reported fatigue and constipation.  Denied fever, cough, dysuria, hematuria, frequency or urgency.  Denied vaginal discharge.  In ED developed developed tonic-clonic seizure, treated with Ativan, Na noted to be 116, started on hypertonic saline bolus.  PCCM was consulted.  SIGNIFICANT EVENTS / STUDIES:  12-21-2022 - Head CT >>> nad 2022/12/21 - Abdomen US >>> Hepatic steatosis, hepatomegaly, small amount of perihepatic ascites 2022/12/21 - Pelvis / transvaginal US >>>  Small amount of free fluid compatible with hemoperitoneum (likley secondary to ruptured abdominal pain) - that was discussed with Dr. Carlota Raspberry (Radiology) - no further studies are recommended.  2022-12-21 - Hypertonic saline bolus in ED for Na of 116.  LINES / TUBES:  CULTURES: 2022-12-21 Urine >>>  ANTIBIOTICS: Ceftriaxone Dec 21, 2022 >>>  HISTORY OF PRESENT ILLNESS:  27 yo with past medical history of alcohol abuse (6-12 beers daily, last drink the day before admission) brought to ED with abdominal pain, nausea and vomiting x 2 days. Developed developed tonic-clonic seizure, treated with Ativan, Na noted to be 116, started on hypertonic saline bolus.  PCCM was consulted.  VITAL SIGNS: Temp:  [98 F (36.7 C)-98.4 F (36.9 C)] 98.3 F (36.8 C) (09/23 0425) Pulse Rate:  [94-130] 97 (09/23 0600) Resp:  [18-33] 24 (09/23 0600) BP: (88-137)/(53-89) 114/54 mmHg (09/23 0500) SpO2:  [92 %-98 %] 93 % (09/23 0600) Weight:  [181 lb 3.5 oz (82.2 kg)-186 lb 8.2 oz (84.6 kg)] 186 lb 8.2 oz (84.6 kg) (09/23 0425)   INTAKE / OUTPUT: Intake/Output      12/21/2022 0701 - 09/23 0700 09/23 0701 - 09/24 0700   I.V. (mL/kg) 1275 (15.1)    IV Piggyback 510    Total Intake(mL/kg) 1785 (21.1)    Stool 2700    Total Output 2700     Net -915           PHYSICAL EXAMINATION: General: Sleeping, in no respiratory distress, easily aroused, facial edema Neuro:  Awake, alert, cooperative, protects airway HEENT:  PERRL, dry membranes, bite marks Cardiovascular:  RRR, no m/r/g, - JVD Lungs: diminished bilaterally, strong cough Abdomen:  Soft,minimal periumbilical tenderness without rebound, normoactive bowel sounds Musculoskeletal:  Moves all extremities, no edema Skin:  Intact  LABS:  Recent Labs Lab 2012/12/20 1745 12-20-12 2046 12/20/2012 2047 12/09/12 0100 12/09/12 0520 12/09/12 0845  HGB 13.0  --   --   --  11.2*  --   WBC 16.2*  --   --   --  12.3*  --   PLT 143*  --   --   --  131*  --   NA 116*  --  121* 123* 128* 131*  K 3.1*  --  2.7* 3.1* 3.1* 2.9*  CL 73*  --  80* 85* 91* 93*  CO2 24  --  27 29 29 30   GLUCOSE 160*  --  140* 116* 100* 117*  BUN 3*  --  <3* <3* <3* <3*  CREATININE 0.48*  --  0.55 0.57 0.57 0.55  CALCIUM 6.5*  --  6.3* 6.7* 6.9* 7.3*  MG  --  1.7  --   --  2.0  --   PHOS  --  1.5*  --   --  2.2*  --   AST 74*  --   --   --  55*  --   ALT 67*  --   --   --  49*  --   ALKPHOS 101  --   --   --  76  --   BILITOT 2.0*  --   --   --  1.4*  --   PROT 5.8*  --   --   --  4.9*  --   ALBUMIN 2.6*  --   --   --  2.0*  --   PROCALCITON  --   --  0.18  --  0.23  --     Recent Labs Lab 12/08/12 2144 12/08/12 2307 12/09/12 0331 12/09/12 0759  GLUCAP 128* 118* 113* 106*   CXR:  none  ASSESSMENT / PLAN:  PULMONARY A:   No active issues. P:   Gaol SpO2>92 Supplemental oxygen PRN OOB to chair today Pulmonary toilet prn  CARDIOVASCULAR A:  Hemodynamically stable.  No arrhythmia / ischemia. P:  Cardiac monitoring Trend vital signs  RENAL A:   Normal renal function.  Hypokalemia.   Hypocalcemia. Hyponatremia (hypo-to-euvolemic, possible beer potomania). Hypophosphotemia P:   Maintain Na >120 NS@ KVO Trend lytes  GASTROINTESTINAL A:   Acute alcoholic hepatitis>>LFT's elevated 9/23 Possible pancreatitis>> Lipase elevated 9/23  Possible ruptured ovarian cyst with hemoperitoneum.  Less likely SBP P:   GI Px is not indicated Trend LFT Trend Lipase  HEMATOLOGIC A:   Mild thrombocytopenia, likely secondary to ETOH. P:  Trend CBC/ platelets 131 on 9/23 SCD for DVT Px  INFECTIOUS A: Possible SBP Possible UTI - PCT trending up 9/23 P:   Cultures and antibiotics as above Trend PCT GC/Chlamydia probe If clinical change, consider sampling of ascites fluid  ENDOCRINE  A:   Hyperglycemia resolved P: CBG's   SSI  NEUROLOGIC A:   Alcohol abuse. Seizures in setting of alcohol withdrawal / hyponatremia.  P:   Defer maintenance anticonvulsives Ativan PRN for elevated CIWA / seizures Thiamin / Folate Seizure precautions Neuro-checks  Scribed by Kandice Robinsons, RN Student ACNP, [employer], for Canary Brim, NP-C  Canary Brim, NP-C [facility] Pgr: [phone] or [phone]   Reviewed above, examined pt.  She has symptomatic hyponatremia with seizures >> likely 2nd to ETOH.  Do not think she needs AED's unless she has recurrent seizures.  She reports her last drink of alcohol was about 24 hours prior to admission >> will need to monitor for DT's.    Discussed Abd imaging findings with her >> explained importance of alcohol abstinence.  She will also need Gyn follow up as outpt.  Will ask social worker to assist with arranging for outpt substance abuse support, and financial assistance options.  Will ask [facility] to assume care from 9/24 and PCCM sign off.  Coralyn Helling, MD [facility] Pulmonary/Critical Care 12/09/2012, 12:22 PM Pager:  [phone] After 3pm call: [phone]

## 2012-12-09 NOTE — Progress Notes (Signed)
CRITICAL VALUE ALERT  Critical value received:  Serum Osmolarity  Date of notification:  12/09/2012  Time of notification:  05:24  Critical value read back:yes  Nurse who received alert:  Guinevere Scarlet, RN  MD notified (1st page):  Rhys Martini  Time of first page:  05:24  MD notified (2nd page):  Time of second page:  Responding MD:  Rhys Martini  Time MD responded:  05:24

## 2012-12-10 ENCOUNTER — Encounter (HOSPITAL_COMMUNITY): Payer: Self-pay | Admitting: *Deleted

## 2012-12-10 DIAGNOSIS — K859 Acute pancreatitis without necrosis or infection, unspecified: Secondary | ICD-10-CM | POA: Diagnosis present

## 2012-12-10 LAB — GLUCOSE, CAPILLARY: Glucose-Capillary: 74 mg/dL (ref 70–99)

## 2012-12-10 LAB — CBC
HCT: 33.1 % — ABNORMAL LOW (ref 36.0–46.0)
Hemoglobin: 11.3 g/dL — ABNORMAL LOW (ref 12.0–15.0)
MCH: 31.8 pg (ref 26.0–34.0)
Platelets: 136 10*3/uL — ABNORMAL LOW (ref 150–400)
RBC: 3.55 MIL/uL — ABNORMAL LOW (ref 3.87–5.11)
WBC: 8.7 10*3/uL (ref 4.0–10.5)

## 2012-12-10 LAB — LIPASE, BLOOD: Lipase: 84 U/L — ABNORMAL HIGH (ref 11–59)

## 2012-12-10 LAB — BASIC METABOLIC PANEL
BUN: 5 mg/dL — ABNORMAL LOW (ref 6–23)
CO2: 26 mEq/L (ref 19–32)
Calcium: 8.4 mg/dL (ref 8.4–10.5)
Calcium: 8.8 mg/dL (ref 8.4–10.5)
Creatinine, Ser: 0.45 mg/dL — ABNORMAL LOW (ref 0.50–1.10)
GFR calc Af Amer: >90 mL/min (ref >90)
GFR calc non Af Amer: >90 mL/min (ref >90)
GFR calc non Af Amer: >90 mL/min (ref >90)
Potassium: 4 mEq/L (ref 3.5–5.1)
Sodium: 130 mEq/L — ABNORMAL LOW (ref 135–145)
Sodium: 133 mEq/L — ABNORMAL LOW (ref 135–145)

## 2012-12-10 LAB — PHOSPHORUS: Phosphorus: 2.2 mg/dL — ABNORMAL LOW (ref 2.3–4.6)

## 2012-12-10 LAB — GC/CHLAMYDIA PROBE AMP
CT Probe RNA: NEGATIVE
GC Probe RNA: NEGATIVE

## 2012-12-10 LAB — MAGNESIUM: Magnesium: 2.2 mg/dL (ref 1.5–2.5)

## 2012-12-10 MED ORDER — LORAZEPAM 1 MG PO TABS
0.0000 mg | ORAL_TABLET | Freq: Four times a day (QID) | ORAL | Status: DC
  Administered 2012-12-11: 04:00:00 1 mg via ORAL
  Filled 2012-12-10: qty 1

## 2012-12-10 MED ORDER — LORAZEPAM 2 MG/ML IJ SOLN
1.0000 mg | Freq: Four times a day (QID) | INTRAMUSCULAR | Status: DC | PRN
  Administered 2012-12-10: 1 mg via INTRAVENOUS
  Filled 2012-12-10: qty 1

## 2012-12-10 MED ORDER — SODIUM CHLORIDE 0.9 % IV SOLN
INTRAVENOUS | Status: DC
  Administered 2012-12-10 – 2012-12-11 (×3): via INTRAVENOUS

## 2012-12-10 MED ORDER — POTASSIUM CHLORIDE 10 MEQ/100ML IV SOLN
10.0000 meq | INTRAVENOUS | Status: AC
  Administered 2012-12-10 (×4): 10 meq via INTRAVENOUS
  Filled 2012-12-10: qty 100

## 2012-12-10 MED ORDER — LORAZEPAM 1 MG PO TABS: 1.0000 mg | ORAL_TABLET | Freq: Four times a day (QID) | ORAL | Status: DC | PRN

## 2012-12-10 MED ORDER — LORAZEPAM 1 MG PO TABS: 0.0000 mg | ORAL_TABLET | Freq: Two times a day (BID) | ORAL | Status: DC

## 2012-12-10 NOTE — Progress Notes (Signed)
[facility] PROGRESS NOTE  Beverly Greene ZOX:[id number] DOB: [birth year] DOA: 12/08/2012  PCP: Jeanann Lewandowsky, MD  Brief HPI: 27 yo with past medical history of alcohol abuse (6-12 beers daily, last drink the day before admission) brought to ED with abdominal pain, nausea and vomiting x 2 days. Also reported fatigue and constipation. Denied fever, cough, dysuria, hematuria, frequency or urgency. Denied vaginal discharge. In ED developed developed tonic-clonic seizure, treated with Ativan, Na noted to be 116, started on hypertonic saline bolus. Patient was admitted initially to [facility] service and then transferred to [facility].  Past medical history:  Past Medical History  Diagnosis Date  . Asthma   . Hypertension     Consultants: PCCM (Signed off)  Procedures: None  Antibiotics: Ceftriaxone 9/22-->  Subjective: Patient feels well. No further seizures. No nausea or vomiting. No abdominal pain. No cough.  Objective: Vital Signs  Filed Vitals:   12/10/12 0300 12/10/12 0400 12/10/12 0500 12/10/12 0600  BP: 113/84  125/80 121/73  Pulse:      Temp:      TempSrc:      Resp: 32 21 36 36  Height:      Weight:      SpO2:        Filed Weights   12/08/12 2000 12/09/12 0425  Weight: 82.2 kg (181 lb 3.5 oz) 84.6 kg (186 lb 8.2 oz)    Intake/Output from previous day: 09/23 0701 - 09/24 0700 In: 1524 [P.O.:240; I.V.:230; IV Piggyback:1054] Out: 1650 [Urine:1050; Stool:600]  General appearance: alert, cooperative and no distress Head: Normocephalic, without obvious abnormality, atraumatic Resp: clear to auscultation bilaterally Cardio: regular rate and rhythm, S1, S2 normal, no murmur, click, rub or gallop GI: soft, non-tender; bowel sounds normal; no masses,  no organomegaly Extremities: extremities normal, atraumatic, no cyanosis or edema Neurologic: Alert and oriented x 3. No focal deficits.  Lab Results:  Basic Metabolic Panel:  Recent Labs Lab  12/08/12 1745 12/08/12 2046 12/08/12 2047 12/09/12 0100 12/09/12 0520 12/09/12 0845 12/09/12 2058 12/10/12 0330  NA 116*  --  121* 123* 128* 131* 134*  --   K 3.1*  --  2.7* 3.1* 3.1* 2.9* 3.2*  --   CL 73*  --  80* 85* 91* 93* 98  --   CO2 24  --  27 29 29 30 29   --   GLUCOSE 160*  --  140* 116* 100* 117* 99  --   BUN 3*  --  <3* <3* <3* <3* 2*  --   CREATININE 0.48*  --  0.55 0.57 0.57 0.55 0.57  --   CALCIUM 6.5*  --  6.3* 6.7* 6.9* 7.3* 7.8*  --   MG  --  1.7  --   --  2.0  --   --  2.2  PHOS  --  1.5*  --   --  2.2*  --   --  2.2*   Liver Function Tests:  Recent Labs Lab 12/08/12 1745 12/09/12 0520  AST 74* 55*  ALT 67* 49*  ALKPHOS 101 76  BILITOT 2.0* 1.4*  PROT 5.8* 4.9*  ALBUMIN 2.6* 2.0*    Recent Labs Lab 12/08/12 2046 12/10/12 0330  LIPASE 145* 84*  AMYLASE 48  --    CBC:  Recent Labs Lab 12/08/12 1745 12/09/12 0520 12/10/12 0330  WBC 16.2* 12.3* 8.7  NEUTROABS 14.6*  --   --   HGB 13.0 11.2* 11.3*  HCT 36.3 30.8* 33.1*  MCV 89.4 89.8 93.2  PLT 143* 131* 136*   CBG:  Recent Labs Lab 12/09/12 1137 12/09/12 1631 12/09/12 1948 12/09/12 2328 12/10/12 0321  GLUCAP 132* 109* 101* 94 88    Recent Results (from the past 240 hour(s))  URINE CULTURE     Status: None   Collection Time    12/08/12  4:52 PM      Result Value Range Status   Specimen Description URINE, CLEAN CATCH   Final   Special Requests NONE   Final   Culture  Setup Time     Final   Value: 12/09/2012 01:16     Performed at [facility] Count     Final   Value: NO GROWTH     Performed at [facility]   Culture     Final   Value: NO GROWTH     Performed at [facility]   Report Status 12/09/2012 FINAL   Final      Studies/Results: Ct Head Wo Contrast  12/08/2012   CLINICAL DATA:  Seizure, presumably from hyponatremia.  EXAM: CT HEAD WITHOUT CONTRAST  TECHNIQUE: Contiguous axial images were obtained from the base of the skull  through the vertex without intravenous contrast.  COMPARISON:  12/08/2012  FINDINGS: Study is degraded by motion.  Allowing for the above limitation, the ventricles are normal in size and configuration. There are no parenchymal masses or mass effect. There are no areas of abnormal parenchymal attenuation. There is no evidence of a transcortical infarct.  There are no extra-axial masses or abnormal fluid collections.  There is no intracranial hemorrhage.  The visualized sinuses and mastoid air cells are clear.  IMPRESSION: Normal unenhanced CT scan of the brain.   Electronically Signed   By: Amie Portland   On: 12/08/2012 19:11   US Abdomen Complete  12/08/2012   CLINICAL DATA:  Abdominal pain. Flank pain.  EXAM: ABDOMEN ULTRASOUND  COMPARISON:  None.  FINDINGS: Gallbladder  No gallstones or wall thickening. Negative sonographic Murphy's sign.  Common bile duct  Diameter: Upper normal, 6 mm  Liver  Increased echogenicity. Suspicion of hepatomegaly, greater than 18.4 cm.  IVC  No abnormality visualized.  Pancreas  Obscured by overlying bowel gas.  Spleen  Size and appearance within normal limits.  Right Kidney  Length: 10.8 cm.  No hydronephrosis.  Left Kidney  Length: 9.9 cm. Obscured by overlying bowel gas.  Abdominal aorta  Not well visualized. No aneurysm identified. Small volume perihepatic fluid identified.  EXAM degraded by seizure, which occurred during the attempted evaluation.  IMPRESSION: 1. Degraded exam, secondary to seizure and patient clinical status. 2. Hepatic steatosis and hepatomegaly. 3. Small volume nonspecific perihepatic ascites.   Electronically Signed   By: Jeronimo Greaves   On: 12/08/2012 19:15   US Transvaginal Non-ob  12/08/2012   CLINICAL DATA:  Pelvic pain.  EXAM: TRANSABDOMINAL AND TRANSVAGINAL ULTRASOUND OF PELVIS  TECHNIQUE: Both transabdominal and transvaginal ultrasound examinations of the pelvis were performed. Transabdominal technique was performed for global imaging of the  pelvis including uterus, ovaries, adnexal regions, and pelvic cul-de-sac. It was necessary to proceed with endovaginal exam following the transabdominal exam to visualize the adnexal structures.  COMPARISON:  Abdominal ultrasound today.  FINDINGS: Uterus  Measurements: 60 mm x 26 mm x 32 mm. No fibroids or other mass visualized.  Endometrium  Thickness: 4 mm, normal.  No focal abnormality visualized.  Right ovary  Measurements: 27 mm x 20 mm x 27 mm. Normal  appearance/no adnexal mass.  Left ovary  Measurements: 32 mm x 19 mm x 34 mm. Normal appearance/no adnexal mass.  Other findings  There is a moderate amount of hyperechoic free fluid. Although the ultrasound appearance is nonspecific, this likely represents hemoperitoneum. In this age group, this is most commonly associated with ruptured ovarian cyst. Infected ascites could produce a similar appearance although that is typically more loculated.  IMPRESSION: 1. Moderate amount of echogenic free fluid most compatible with hemoperitoneum. In this age group, this likely results from a ruptured ovarian cyst. 2. Physiologic appearance of the uterus and adnexa.   Electronically Signed   By: Andreas Newport M.D.   On: 12/08/2012 19:28   US Pelvis Complete  12/08/2012   CLINICAL DATA:  Pelvic pain.  EXAM: TRANSABDOMINAL AND TRANSVAGINAL ULTRASOUND OF PELVIS  TECHNIQUE: Both transabdominal and transvaginal ultrasound examinations of the pelvis were performed. Transabdominal technique was performed for global imaging of the pelvis including uterus, ovaries, adnexal regions, and pelvic cul-de-sac. It was necessary to proceed with endovaginal exam following the transabdominal exam to visualize the adnexal structures.  COMPARISON:  Abdominal ultrasound today.  FINDINGS: Uterus  Measurements: 60 mm x 26 mm x 32 mm. No fibroids or other mass visualized.  Endometrium  Thickness: 4 mm, normal.  No focal abnormality visualized.  Right ovary  Measurements: 27 mm x 20 mm x 27 mm.  Normal appearance/no adnexal mass.  Left ovary  Measurements: 32 mm x 19 mm x 34 mm. Normal appearance/no adnexal mass.  Other findings  There is a moderate amount of hyperechoic free fluid. Although the ultrasound appearance is nonspecific, this likely represents hemoperitoneum. In this age group, this is most commonly associated with ruptured ovarian cyst. Infected ascites could produce a similar appearance although that is typically more loculated.  IMPRESSION: 1. Moderate amount of echogenic free fluid most compatible with hemoperitoneum. In this age group, this likely results from a ruptured ovarian cyst. 2. Physiologic appearance of the uterus and adnexa.   Electronically Signed   By: Andreas Newport M.D.   On: 12/08/2012 19:28    Medications:  Scheduled: . cefTRIAXone (ROCEPHIN)  IV  1 g Intravenous Daily  . folic acid  1 mg Oral Daily  . insulin aspart  0-15 Units Subcutaneous Q4H  . LORazepam  0-4 mg Oral Q6H   Followed by  . [START ON 12/12/2012] LORazepam  0-4 mg Oral Q12H  . nicotine  14 mg Transdermal Daily  . pneumococcal 23 valent vaccine  0.5 mL Intramuscular Tomorrow-1000  . thiamine  100 mg Oral Daily   Continuous: . sodium chloride 10 mL/hr (12/09/12 0641)   ZOX:[id number], LORazepam, ondansetron (ZOFRAN) IV  Assessment/Plan:  Active Problems:   Seizures   Alcohol withdrawal   Hyponatremia   Hypokalemia   Hypocalcemia   Acute alcoholic hepatitis    Sinus Tachycardia Likely from withdrawal, anxiety. Could also be volume depleted. Will give fluids for one more day. Check TSH.  Hypokalemia Will replete  Hyponatremia Due to beer potomania. Required Hypertonic saline due to seizure. Now better.  Seizure Due to alcohol, hyponatremia. No further episodes. No indication for antiepileptic medications.  Acute alcoholic hepatitis LFT's elevated 9/23. Recheck in AM.  Possible Acute Pancreatitis Asymptomatic. Possibly related to ETOH/Nausea/Vomiting. Monitor and  trend.   Possible ruptured ovarian cyst with hemoperitoneum.  Less likely SBP. Asymptomatic. Hgb stable.  Mild thrombocytopenia, likely secondary to ETOH.  Trend CBC/platelets  Possible UTI Urine culture negative. Ceftriaxone.   Alcohol Abuse with  mild Withdrawal Will ask social worker to assist with arranging for outpt substance abuse support, and financial assistance options. Thiamine, folate. Initiate CIWA protocol.  Code Status:  Full Code DVT Prophylaxis: SCD's. Ambulate Family Communication: Discussed with patient.  Disposition Plan: Transfer to Tele.    LOS: 2 days   [facility]  [facility] Pager [phone] 12/10/2012, 8:32 AM  If 8PM-8AM, please contact night-coverage at [email or web address], password [facility]

## 2012-12-10 NOTE — Clinical Documentation Improvement (Signed)
  THIS DOCUMENT IS NOT A PERMANENT PART OF THE MEDICAL RECORD  Please update your documentation within the medical record to reflect your response to this query. If you need help knowing how to do this please call [phone].  12/10/12  Dear Dr. Rito Ehrlich, [facility]  In a better effort to capture your patient's severity of illness, reflect appropriate length of stay and utilization of resources, a review of the medical record has revealed the following indicators.    Based on your clinical judgment, please clarify and document in a progress note and/or discharge summary the clinical condition associated with the following supporting information:  In responding to this query please exercise your independent judgment.  The fact that a query is asked, does not imply that any particular answer is desired or expected.  Abnormal findings (laboratory, x-ray, pathologic, and other diagnostic results) are not coded and reported unless the physician indicates their clinical significance.   The medical record reflects the following clinical findings, please clarify the diagnostic and/or clinical significance:      Pt with abnormal UA=Nitrite=positive, Bacteria=Many  Clarification Needed    Please clarify the underlying diagnosis responsible for the abnormal labs and document in the pn or d/c summary.   Possible Clinical Conditions?                                    Other Condition               Cannot Clinically Determine   Supporting Information:  Acute alcoholic hepatitis  Acute pancreatitis  Ascites  Alcohol withdrawal  Alcoholic cirrhosis of the liver  Diagnostics Component      WBC  Latest Ref Rng      4.0 - 10.5 K/uL  12/08/2012     5:45 PM 16.2 (H)   Component      WBC  Latest Ref Rng      4.0 - 10.5 K/uL  12/09/2012     5:20 AM 12.3 (H)   Component      Nitrite Leukocytes, UA  Latest Ref Rng      NEGATIVE NEGATIVE  12/08/2012     4:52 PM POSITIVE (A)  SMALL (A)   Component      Bacteria, UA  Latest Ref Rng      RARE  12/08/2012     4:52 PM MANY (A)   Treatment:  Rochephin  Reviewed:  no additional documentation provided   Thank You,  Enis Slipper  RN, BSN, MSN/Inf, CCDS Clinical Documentation Specialist [facility] Dept Pager: [phone] / E-mail: [email or web address]  [phone] Health Information Management [place]

## 2012-12-10 NOTE — Progress Notes (Signed)
eLink Physician-Brief Progress Note Patient Name: Beverly Greene DOB: 1985-12-30 MRN: [id number]  Date of Service  12/10/2012   HPI/Events of Note  Hypokalemia   eICU Interventions  Potassium replaced   Intervention Category Minor Interventions: Electrolytes abnormality - evaluation and management  Lux Skilton 12/10/2012, 1:20 AM

## 2012-12-11 LAB — COMPREHENSIVE METABOLIC PANEL
ALT: 108 U/L — ABNORMAL HIGH (ref 0–35)
AST: 325 U/L — ABNORMAL HIGH (ref 0–37)
Albumin: 2 g/dL — ABNORMAL LOW (ref 3.5–5.2)
Alkaline Phosphatase: 67 U/L (ref 39–117)
CO2: 26 mEq/L (ref 19–32)
Calcium: 8.3 mg/dL — ABNORMAL LOW (ref 8.4–10.5)
Chloride: 101 mEq/L (ref 96–112)
Creatinine, Ser: 0.55 mg/dL (ref 0.50–1.10)
GFR calc non Af Amer: >90 mL/min (ref >90)
Potassium: 3.7 mEq/L (ref 3.5–5.1)
Total Bilirubin: 1.1 mg/dL (ref 0.3–1.2)

## 2012-12-11 LAB — CBC
MCH: 32.6 pg (ref 26.0–34.0)
MCHC: 34.1 g/dL (ref 30.0–36.0)
MCV: 95.5 fL (ref 78.0–100.0)
Platelets: 138 10*3/uL — ABNORMAL LOW (ref 150–400)
RBC: 3.13 MIL/uL — ABNORMAL LOW (ref 3.87–5.11)
RDW: 15.4 % (ref 11.5–15.5)

## 2012-12-11 LAB — LIPASE, BLOOD: Lipase: 74 U/L — ABNORMAL HIGH (ref 11–59)

## 2012-12-11 NOTE — Progress Notes (Signed)
After signing out AMA pt also left the unit without letting staff know she was doing so.

## 2012-12-11 NOTE — Progress Notes (Signed)
Pt stated that if she were not discharged by the MD then she would be signing out AMA. I informed the pt that if she were to sign out AMA that the hospital and staff could not be held responsible for any negative outcomes after the pt had left. The pt stated that she understood this and accepted it. MD was paged and came to the floor promptly to talk with and evaluate the pt. After doing so, he informed her that she needed further evaluation before he could safely DC her. Pt then stated that she still wanted to leave and would sign the AMA forms. I had the pt sign the AMA form, removed her IV and tele monitor. Pt waiting on her ride.

## 2012-12-11 NOTE — Progress Notes (Signed)
[facility] called and informed pt wants to sign out ama.

## 2012-12-11 NOTE — Discharge Summary (Signed)
[facility]  Physician Discharge Summary   Patient ID: Beverly Greene MRN: [id number] DOB/AGE: [birth year] 29 y.o.  Admit date: 12/08/2012 Discharge date: 12/11/2012  PCP: Jeanann Lewandowsky, MD  DISCHARGE DIAGNOSES:  Active Problems:   Seizures   Alcohol withdrawal   Hyponatremia   Hypokalemia   Hypocalcemia   Acute alcoholic hepatitis   Pancreatitis, acute  PATIENT LEFT AGAINST MEDICAL ADVICE  RECOMMENDATIONS FOR OUTPATIENT FOLLOW UP: 1. None could be provided as she left AMA.  INITIAL HISTORY: 27 yo with past medical history of alcohol abuse (6-12 beers daily, last drink the day before admission) brought to ED with abdominal pain, nausea and vomiting x 2 days. Also reported fatigue and constipation. Denied fever, cough, dysuria, hematuria, frequency or urgency. Denied vaginal discharge. In ED developed developed tonic-clonic seizure, treated with Ativan, Na noted to be 116, started on hypertonic saline bolus. Patient was admitted initially to [facility] service and then transferred to [facility].  Consultations:  PCCM   Procedures:  None  HOSPITAL COURSE:   Seizure  This was most likely due to alcohol and hyponatremia. Her sodium was brought up quickly with hypertonic saline. She did not have further episodes. No indication for antiepileptic medications.   Acute alcoholic hepatitis  LFT's were higher today. She had an ultrasound which revealed steatosis. I recommended that she stay till counts stabilize and perhaps check hepatitis panel. Patient declined and wanted to go home.   Possible Acute Pancreatitis  She remained asymptomatic. Possibly related to ETOH/Nausea/Vomiting. Lipase trended down.  Sinus Tachycardia  Likely from withdrawal, anxiety. She was started on IVF due to possibility of volume depletion. TSH was normal.   Hypokalemia  Was repleted.   Hyponatremia  Due to beer potomania. Required Hypertonic saline due to seizure. Remained stable in  130's.   Possible ruptured ovarian cyst with hemoperitoneum.  Detected on pelvic ultrasound. This was less likely to be SBP. She remained asymptomatic and afebrile.   Mild thrombocytopenia, likely secondary to ETOH.  We were trending CBC/platelets   Possible UTI  Urine culture negative. Ceftriaxone was to be stopped today.  Alcohol Abuse with mild Withdrawal  Had asked social worker to assist with arranging for outpt substance abuse support, and financial assistance options. Thiamine, folate. Initiate CIWA protocol were on board. She was not exhibiting any signs of active withdrawal currently. She had capacity to decide for herself. She was told that symptoms could manifest later as well.  She wanted to leave AMA. She was told about risks of doing so including deterioration in health with worsening. She understood and wanted to leave anyway.  PERTINENT LABS:  The results of significant diagnostics from this hospitalization (including imaging, microbiology, ancillary and laboratory) are listed below for reference.    Labs: Basic Metabolic Panel:  Recent Labs Lab 12/08/12 1745 12/08/12 2046  12/09/12 0520 12/09/12 0845 12/09/12 2058 12/10/12 0330 12/10/12 0905 12/10/12 2105 12/11/12 0430  NA 116*  --   < > 128* 131* 134*  --  130* 133* 134*  K 3.1*  --   < > 3.1* 2.9* 3.2*  --  3.8 4.0 3.7  CL 73*  --   < > 91* 93* 98  --  96 97 101  CO2 24  --   < > 29 30 29   --  26 26 26   GLUCOSE 160*  --   < > 100* 117* 99  --  122* 109* 95  BUN 3*  --   < > <3* <3*  2*  --  3* 5* 4*  CREATININE 0.48*  --   < > 0.57 0.55 0.57  --  0.45* 0.54 0.55  CALCIUM 6.5*  --   < > 6.9* 7.3* 7.8*  --  8.4 8.8 8.3*  MG  --  1.7  --  2.0  --   --  2.2  --   --   --   PHOS  --  1.5*  --  2.2*  --   --  2.2*  --   --   --   < > = values in this interval not displayed. Liver Function Tests:  Recent Labs Lab 12/08/12 1745 12/09/12 0520 12/11/12 0430  AST 74* 55* 325*  ALT 67* 49* 108*  ALKPHOS 101  76 67  BILITOT 2.0* 1.4* 1.1  PROT 5.8* 4.9* 5.3*  ALBUMIN 2.6* 2.0* 2.0*    Recent Labs Lab 12/08/12 2046 12/10/12 0330 12/11/12 0430  LIPASE 145* 84* 74*  AMYLASE 48  --   --    CBC:  Recent Labs Lab 12/08/12 1745 12/09/12 0520 12/10/12 0330 12/11/12 0430  WBC 16.2* 12.3* 8.7 8.3  NEUTROABS 14.6*  --   --   --   HGB 13.0 11.2* 11.3* 10.2*  HCT 36.3 30.8* 33.1* 29.9*  MCV 89.4 89.8 93.2 95.5  PLT 143* 131* 136* 138*   CBG:  Recent Labs Lab 12/09/12 2328 12/10/12 0321 12/10/12 0808 12/10/12 1201 12/10/12 1706  GLUCAP 94 88 74 98 130*    IMAGING STUDIES Ct Head Wo Contrast  12/08/2012   CLINICAL DATA:  Seizure, presumably from hyponatremia.  EXAM: CT HEAD WITHOUT CONTRAST  TECHNIQUE: Contiguous axial images were obtained from the base of the skull through the vertex without intravenous contrast.  COMPARISON:  12/08/2012  FINDINGS: Study is degraded by motion.  Allowing for the above limitation, the ventricles are normal in size and configuration. There are no parenchymal masses or mass effect. There are no areas of abnormal parenchymal attenuation. There is no evidence of a transcortical infarct.  There are no extra-axial masses or abnormal fluid collections.  There is no intracranial hemorrhage.  The visualized sinuses and mastoid air cells are clear.  IMPRESSION: Normal unenhanced CT scan of the brain.   Electronically Signed   By: Amie Portland   On: 12/08/2012 19:11   US Abdomen Complete  12/08/2012   CLINICAL DATA:  Abdominal pain. Flank pain.  EXAM: ABDOMEN ULTRASOUND  COMPARISON:  None.  FINDINGS: Gallbladder  No gallstones or wall thickening. Negative sonographic Murphy's sign.  Common bile duct  Diameter: Upper normal, 6 mm  Liver  Increased echogenicity. Suspicion of hepatomegaly, greater than 18.4 cm.  IVC  No abnormality visualized.  Pancreas  Obscured by overlying bowel gas.  Spleen  Size and appearance within normal limits.  Right Kidney  Length: 10.8 cm.   No hydronephrosis.  Left Kidney  Length: 9.9 cm. Obscured by overlying bowel gas.  Abdominal aorta  Not well visualized. No aneurysm identified. Small volume perihepatic fluid identified.  EXAM degraded by seizure, which occurred during the attempted evaluation.  IMPRESSION: 1. Degraded exam, secondary to seizure and patient clinical status. 2. Hepatic steatosis and hepatomegaly. 3. Small volume nonspecific perihepatic ascites.   Electronically Signed   By: Jeronimo Greaves   On: 12/08/2012 19:15   US Transvaginal Non-ob  12/08/2012   CLINICAL DATA:  Pelvic pain.  EXAM: TRANSABDOMINAL AND TRANSVAGINAL ULTRASOUND OF PELVIS  TECHNIQUE: Both transabdominal and transvaginal ultrasound examinations of  the pelvis were performed. Transabdominal technique was performed for global imaging of the pelvis including uterus, ovaries, adnexal regions, and pelvic cul-de-sac. It was necessary to proceed with endovaginal exam following the transabdominal exam to visualize the adnexal structures.  COMPARISON:  Abdominal ultrasound today.  FINDINGS: Uterus  Measurements: 60 mm x 26 mm x 32 mm. No fibroids or other mass visualized.  Endometrium  Thickness: 4 mm, normal.  No focal abnormality visualized.  Right ovary  Measurements: 27 mm x 20 mm x 27 mm. Normal appearance/no adnexal mass.  Left ovary  Measurements: 32 mm x 19 mm x 34 mm. Normal appearance/no adnexal mass.  Other findings  There is a moderate amount of hyperechoic free fluid. Although the ultrasound appearance is nonspecific, this likely represents hemoperitoneum. In this age group, this is most commonly associated with ruptured ovarian cyst. Infected ascites could produce a similar appearance although that is typically more loculated.  IMPRESSION: 1. Moderate amount of echogenic free fluid most compatible with hemoperitoneum. In this age group, this likely results from a ruptured ovarian cyst. 2. Physiologic appearance of the uterus and adnexa.   Electronically Signed    By: Andreas Newport M.D.   On: 12/08/2012 19:28   US Pelvis Complete  12/08/2012   CLINICAL DATA:  Pelvic pain.  EXAM: TRANSABDOMINAL AND TRANSVAGINAL ULTRASOUND OF PELVIS  TECHNIQUE: Both transabdominal and transvaginal ultrasound examinations of the pelvis were performed. Transabdominal technique was performed for global imaging of the pelvis including uterus, ovaries, adnexal regions, and pelvic cul-de-sac. It was necessary to proceed with endovaginal exam following the transabdominal exam to visualize the adnexal structures.  COMPARISON:  Abdominal ultrasound today.  FINDINGS: Uterus  Measurements: 60 mm x 26 mm x 32 mm. No fibroids or other mass visualized.  Endometrium  Thickness: 4 mm, normal.  No focal abnormality visualized.  Right ovary  Measurements: 27 mm x 20 mm x 27 mm. Normal appearance/no adnexal mass.  Left ovary  Measurements: 32 mm x 19 mm x 34 mm. Normal appearance/no adnexal mass.  Other findings  There is a moderate amount of hyperechoic free fluid. Although the ultrasound appearance is nonspecific, this likely represents hemoperitoneum. In this age group, this is most commonly associated with ruptured ovarian cyst. Infected ascites could produce a similar appearance although that is typically more loculated.  IMPRESSION: 1. Moderate amount of echogenic free fluid most compatible with hemoperitoneum. In this age group, this likely results from a ruptured ovarian cyst. 2. Physiologic appearance of the uterus and adnexa.   Electronically Signed   By: Andreas Newport M.D.   On: 12/08/2012 19:28    DISCHARGE EXAMINATION: Not done as she left AMA  DISPOSITION: Left AMA. No discharge medication could be provided.  ALLERGIES: No Known Allergies   [facility]  [facility] Pager [phone]  12/11/2012, 11:07 AM

## 2013-01-01 LAB — PROCEDURE REPORT - SCANNED: Pap: NEGATIVE

## 2013-01-06 ENCOUNTER — Ambulatory Visit: Payer: Self-pay | Admitting: Gynecology

## 2013-01-06 ENCOUNTER — Telehealth: Payer: Self-pay | Admitting: Internal Medicine

## 2013-01-06 NOTE — Telephone Encounter (Signed)
Pt will be coming in today after 2 o'clock to get the results from her visit here on 07/31/2012; visit was with Dr. Laural Benes

## 2014-11-09 IMAGING — US US PELVIS COMPLETE
1 series · 13 of 25 positions shown · non-contrast
Comparison: Abdominal ultrasound today.

CLINICAL DATA: Pelvic pain.

EXAM:
TRANSABDOMINAL AND TRANSVAGINAL ULTRASOUND OF PELVIS
TECHNIQUE: Both transabdominal and transvaginal ultrasound examinations of the
pelvis were performed. Transabdominal technique was performed for
global imaging of the pelvis including uterus, ovaries, adnexal
regions, and pelvic cul-de-sac. It was necessary to proceed with
endovaginal exam following the transabdominal exam to visualize the
adnexal structures..

[Series 1: us pelvis complete · 0.24mm/px · 13 of 45 slices shown]
[im 1/45]
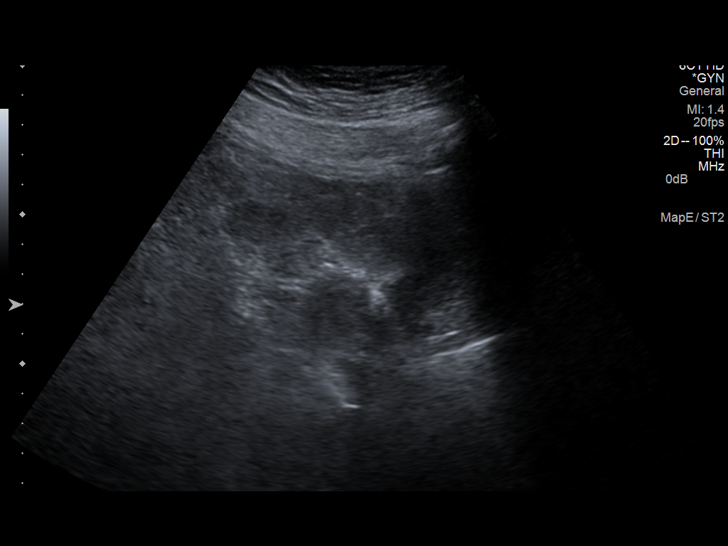
[im 4/45]
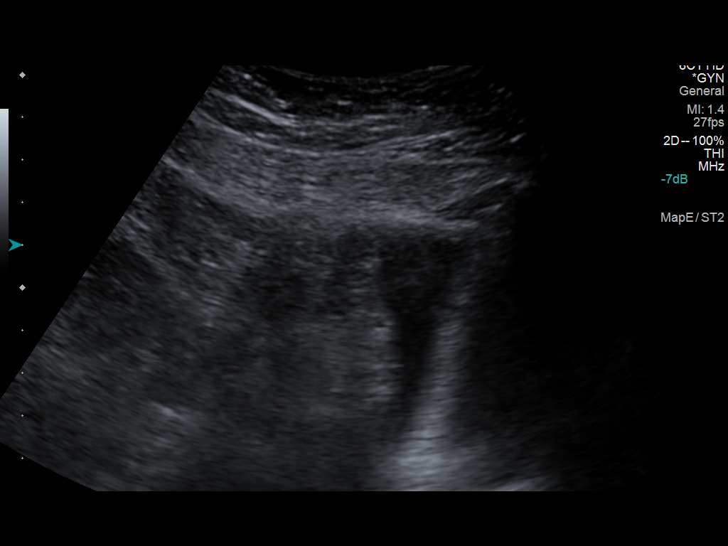
[im 8/45]
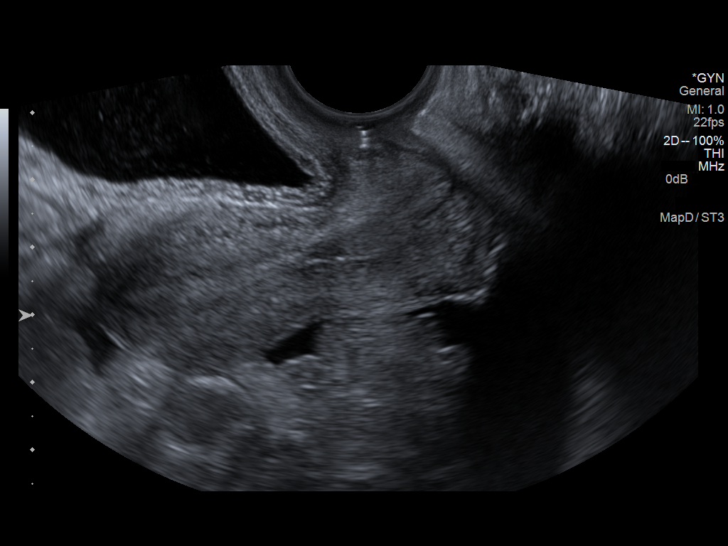
[im 12/45]
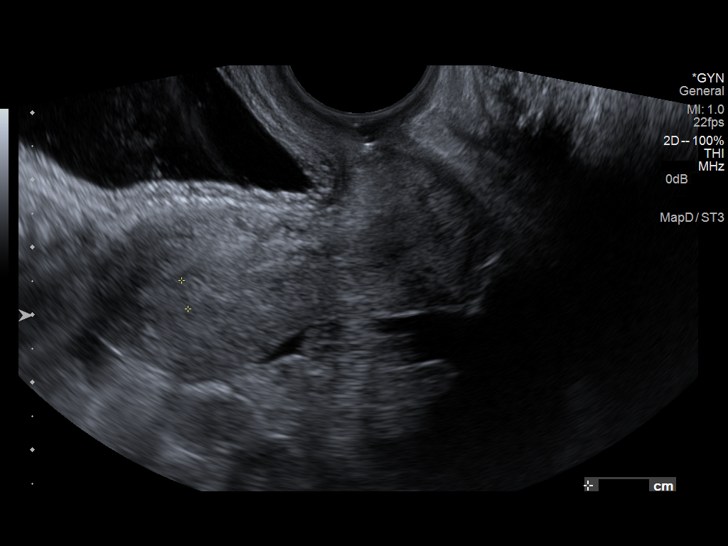
[im 15/45]
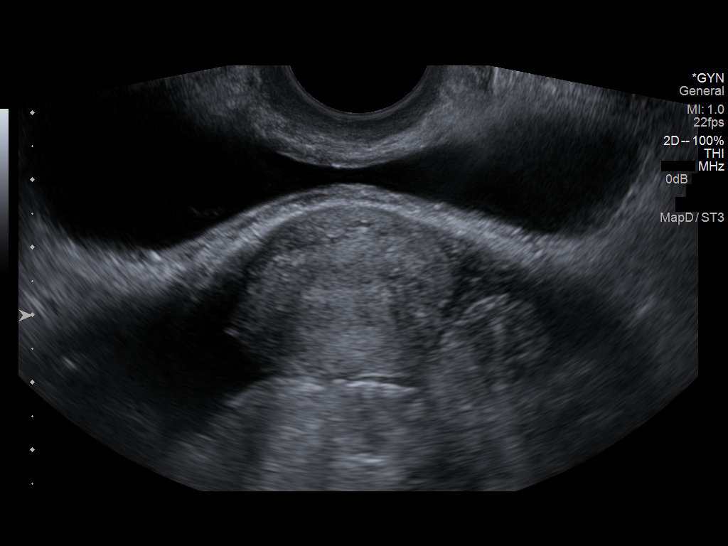
[im 19/45]
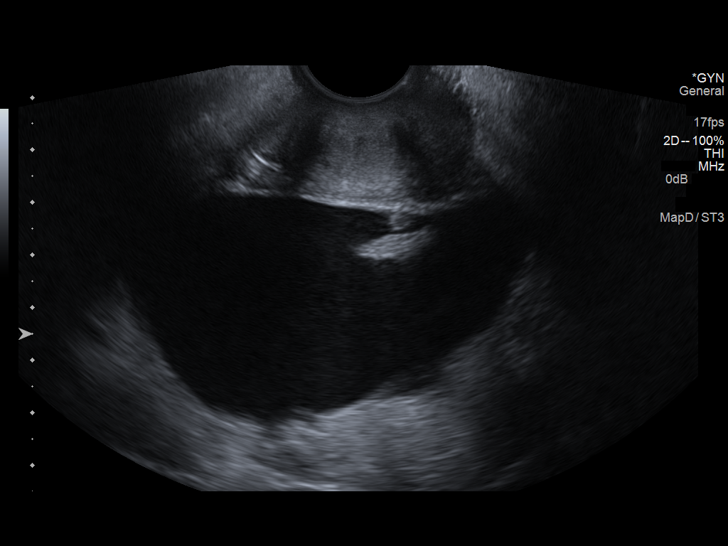
[im 23/45]
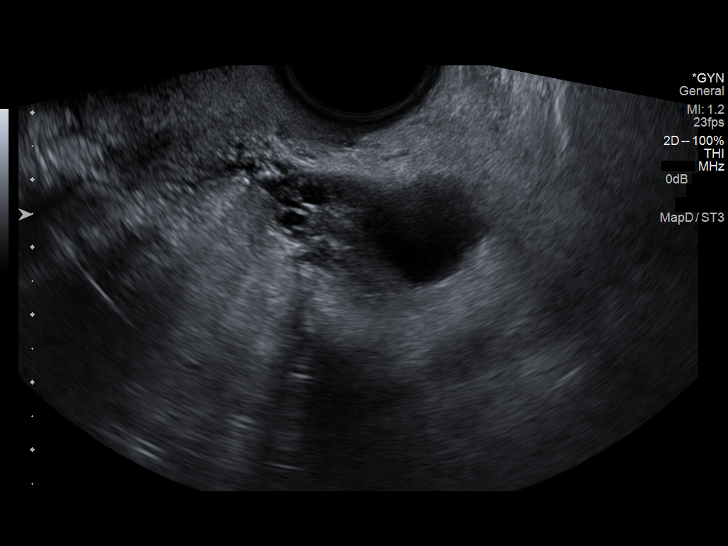
[im 26/45]
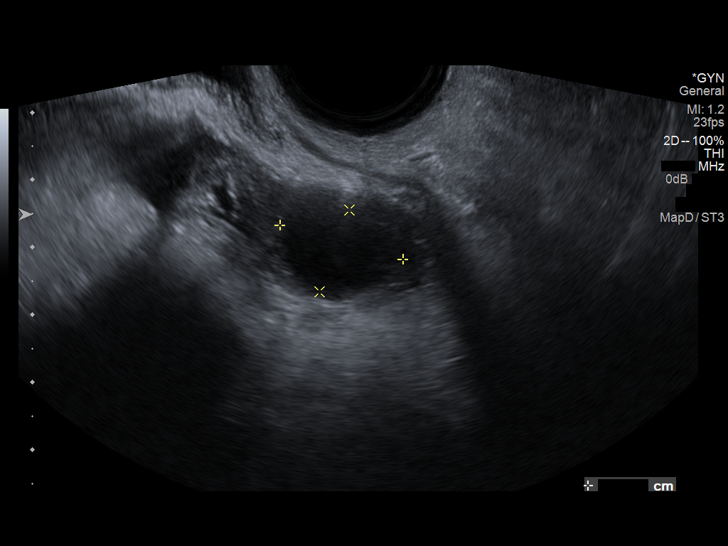
[im 30/45]
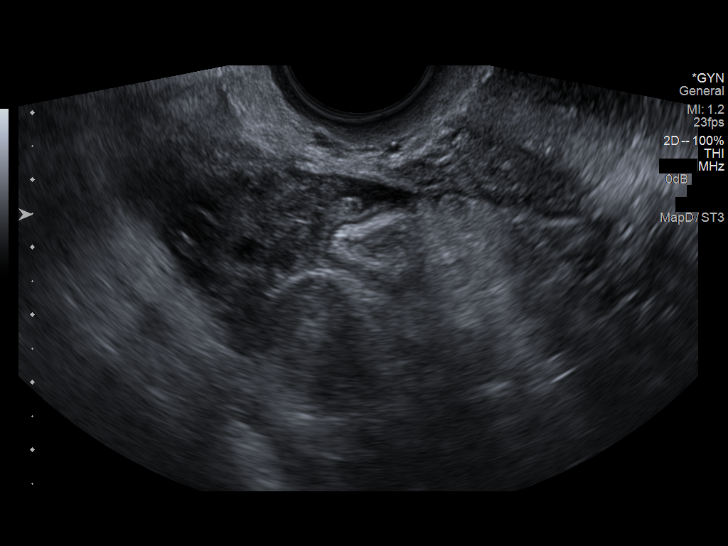
[im 34/45]
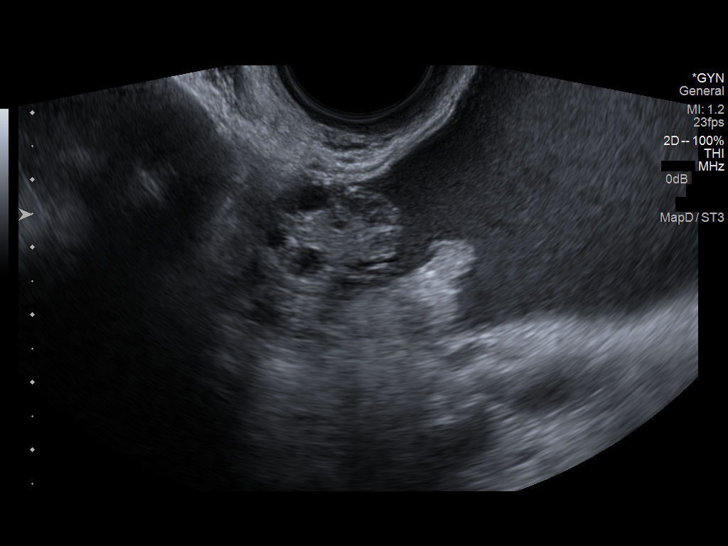
[im 37/45]
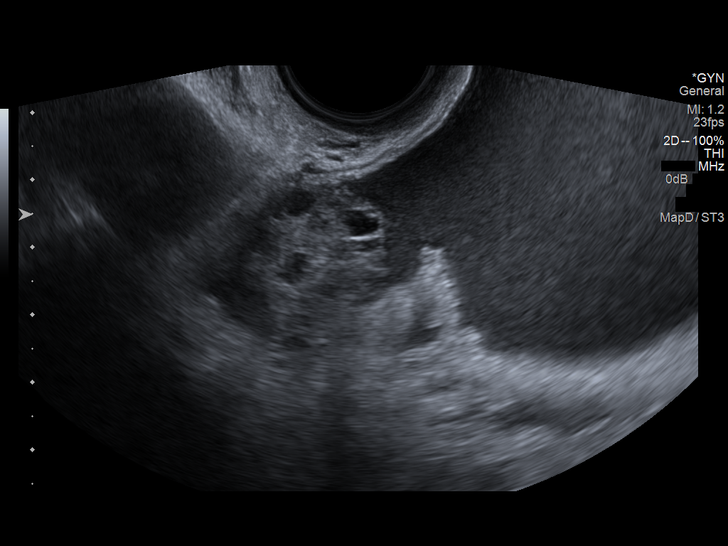
[im 41/45]
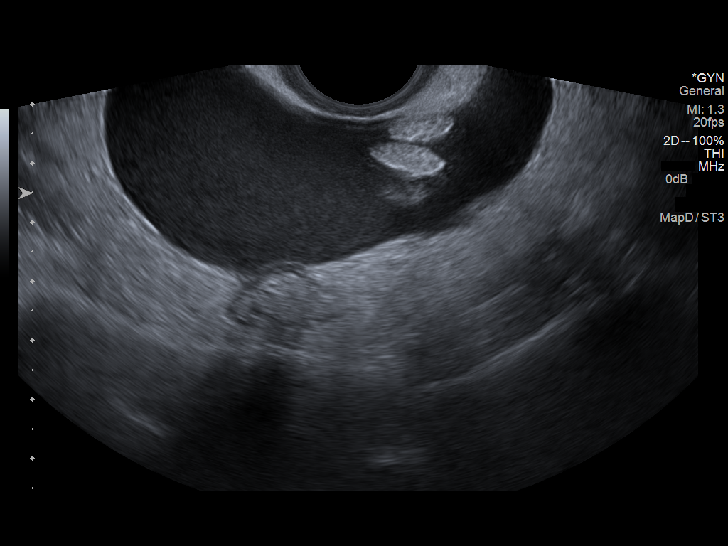
[im 45/45]
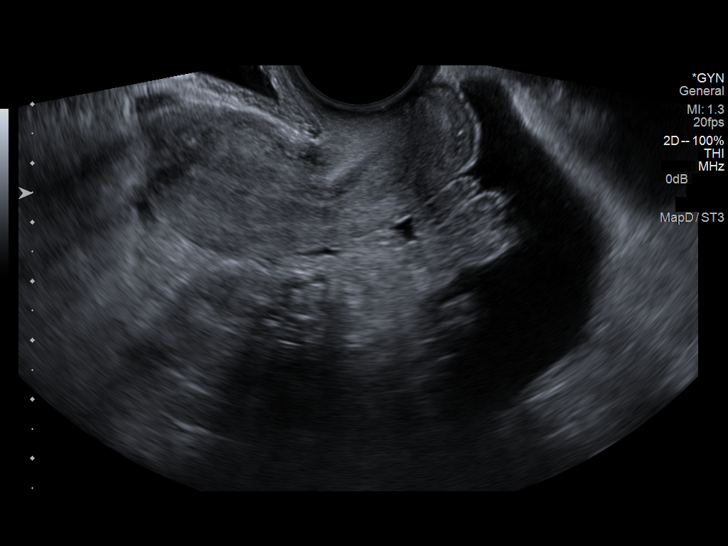

[13 of 25 positions shown; findings below may reference images not displayed]

FINDINGS: Uterus

Measurements: 60 mm x 26 mm x 32 mm. No fibroids or other mass
visualized.

Endometrium

Thickness: 4 mm, normal.  No focal abnormality visualized.

Right ovary

Measurements: 27 mm x 20 mm x 27 mm. Normal appearance/no adnexal
mass.

Left ovary

Measurements: 32 mm x 19 mm x 34 mm. Normal appearance/no adnexal
mass.

Other findings

There is a moderate amount of hyperechoic free fluid. Although the
ultrasound appearance is nonspecific, this likely represents
hemoperitoneum. In this age group, this is most commonly associated
with ruptured ovarian cyst. Infected ascites could produce a similar
appearance although that is typically more loculated.
IMPRESSION: 1. Moderate amount of echogenic free fluid most compatible with
hemoperitoneum. In this age group, this likely results from a
ruptured ovarian cyst.
2. Physiologic appearance of the uterus and adnexa.

## 2014-11-09 IMAGING — CT CT HEAD W/O CM
4 series · 18 of 30 positions shown, 19 images · non-contrast
Comparison: 12/08/2012

CLINICAL DATA: Seizure, presumably from hyponatremia.

EXAM:
CT HEAD WITHOUT CONTRAST
TECHNIQUE: Contiguous axial images were obtained from the base of the skull
through the vertex without intravenous contrast.

[Series 2: head w/o · axial · non-contrast · 0.48mm/px · z∈[-161,-71]mm · 4 of 31 slices shown, 5 images (1 of 2)]
[im 7/31  brain]
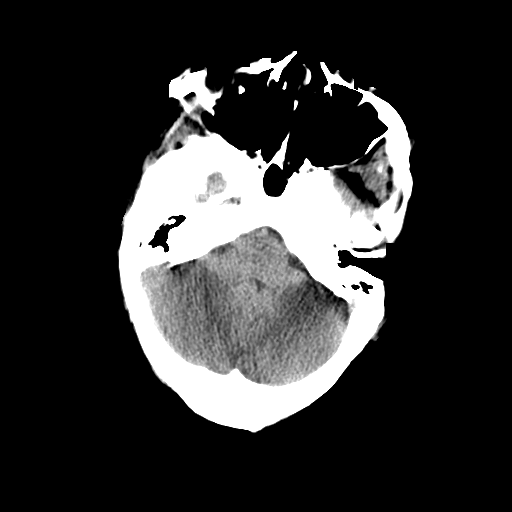
[im 7/31  bone]
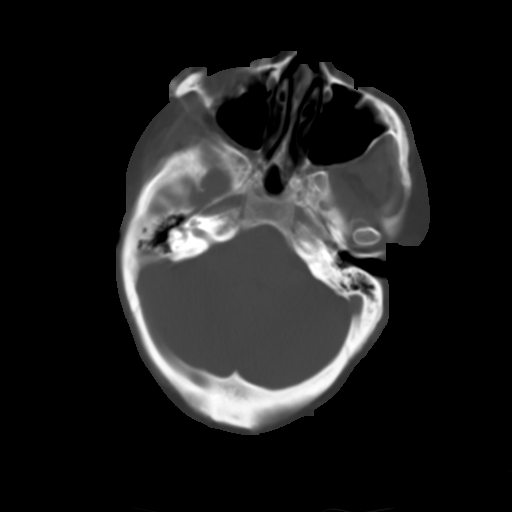
[im 13/31  brain]
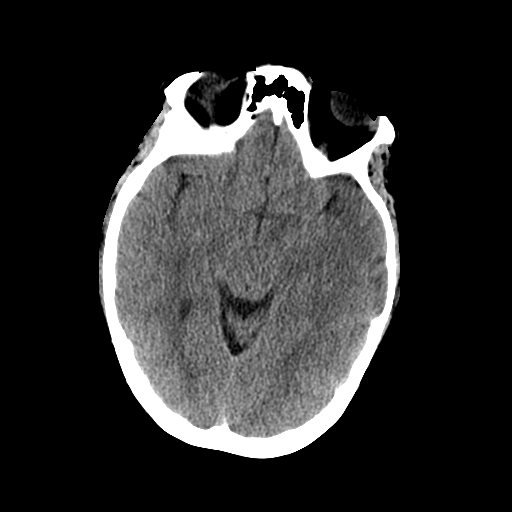
[im 19/31  brain]
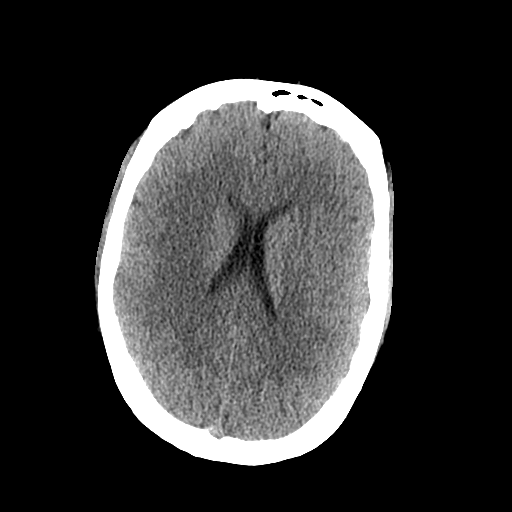
[im 25/31  brain]
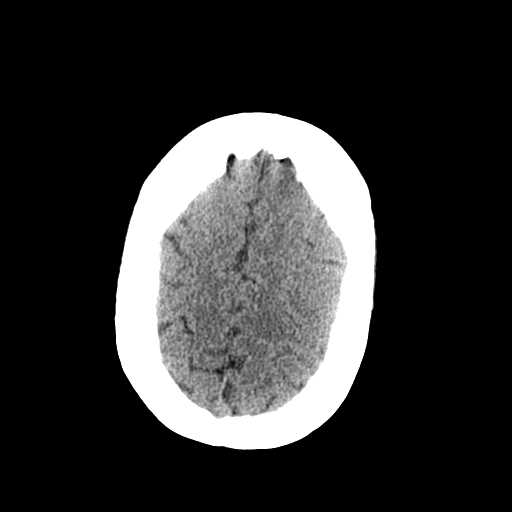

[Series 3: bone windows · axial · 0.48mm/px · z∈[-173,-62]mm · 7 of 51 slices shown (1 of 2)]
[im 7/51  bone]
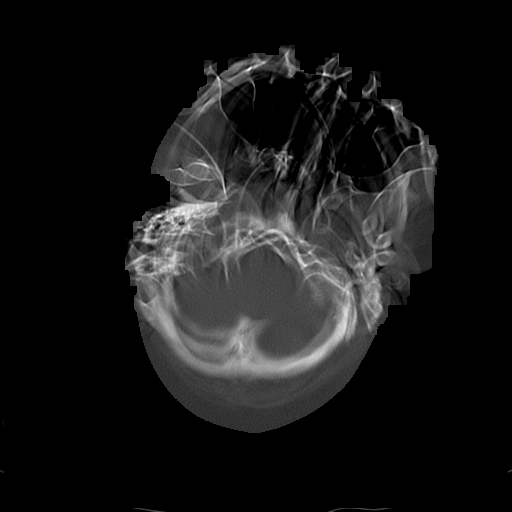
[im 13/51  bone]
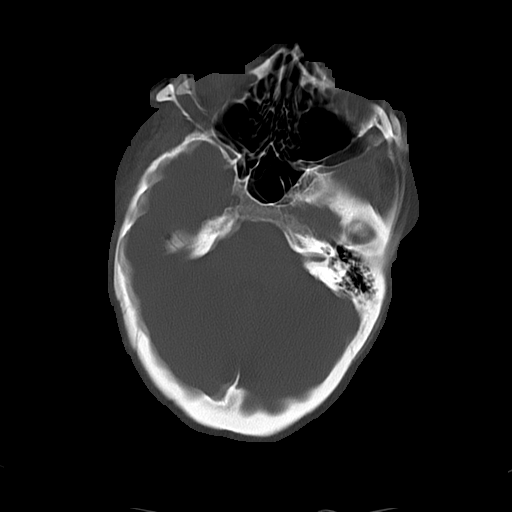
[im 19/51  bone]
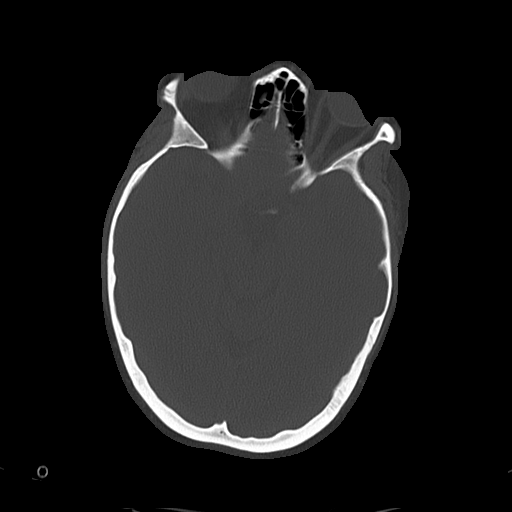
[im 26/51  bone]
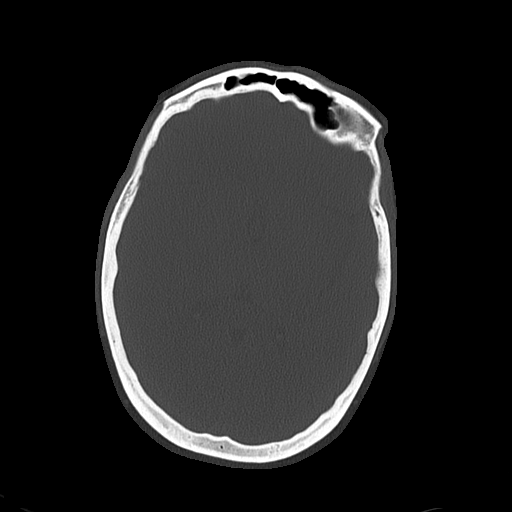
[im 32/51  bone]
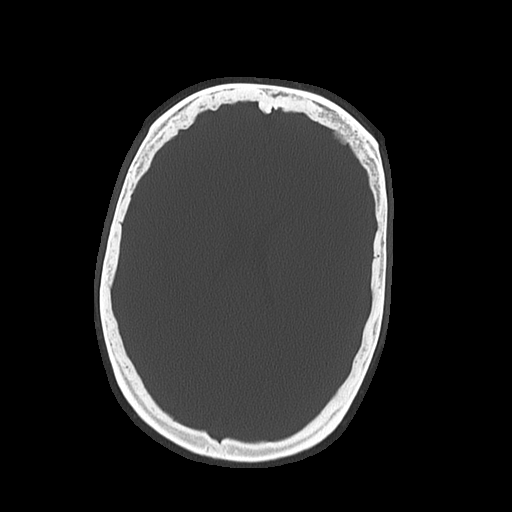
[im 38/51  bone]
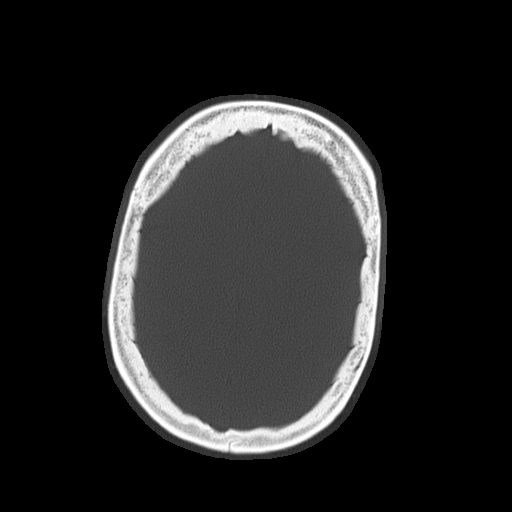
[im 44/51  bone]
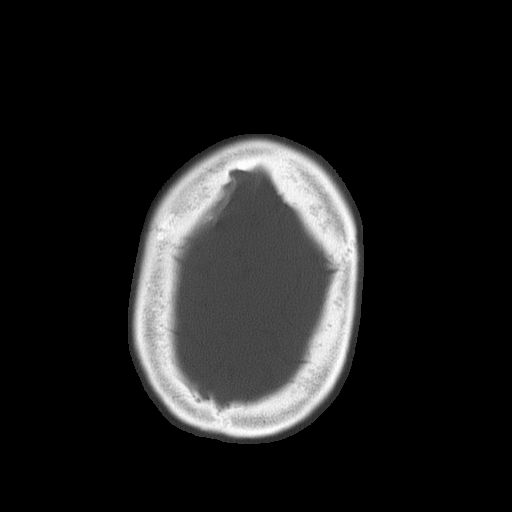

[Series 4: head w/o · axial · non-contrast · 0.48mm/px · z∈[-161,-71]mm · 4 of 31 slices shown (2 of 2)]
[im 7/31  brain]
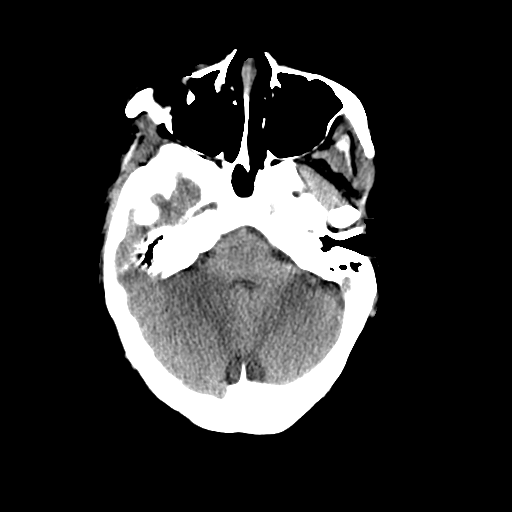
[im 13/31  brain]
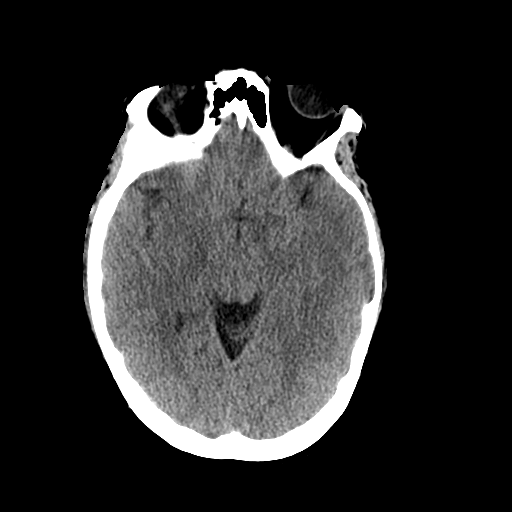
[im 19/31  brain]
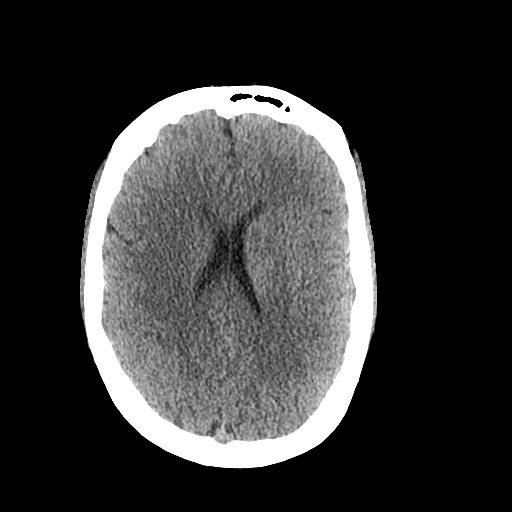
[im 25/31  brain]
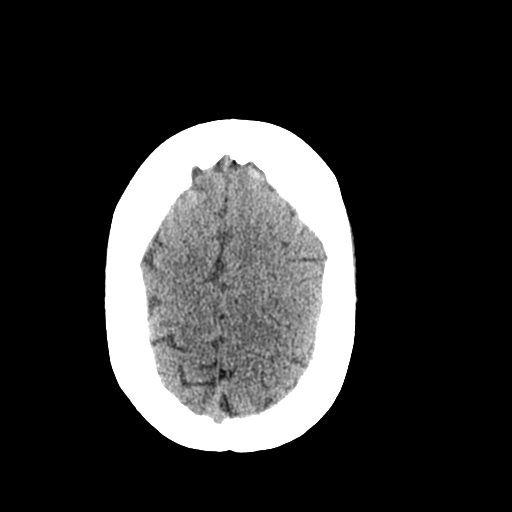

[Series 5: bone windows · axial · 0.48mm/px · z∈[-173,-137]mm · 3 of 51 slices shown (2 of 2)]
[im 7/51  bone]
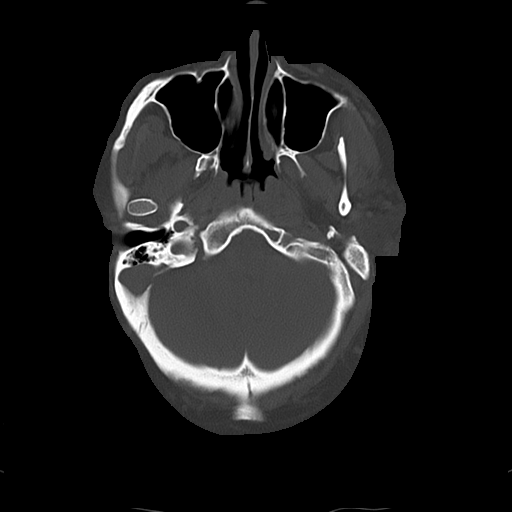
[im 13/51  bone]
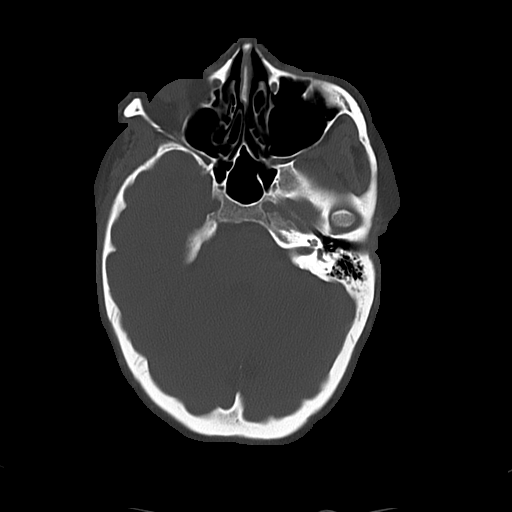
[im 19/51  bone]
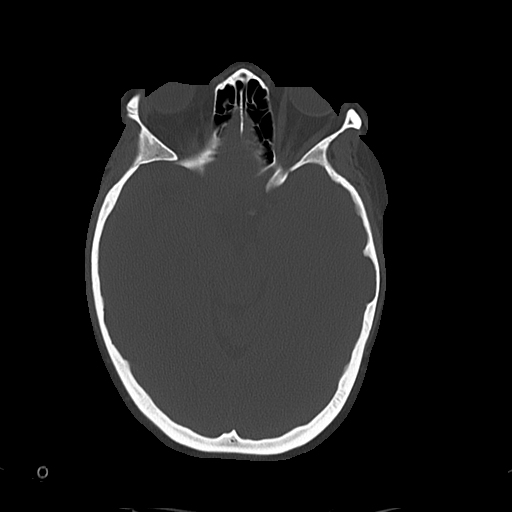

[18 of 30 positions shown; findings below may reference images not displayed]

FINDINGS: Study is degraded by motion.

Allowing for the above limitation, the ventricles are normal in size
and configuration. There are no parenchymal masses or mass effect.
There are no areas of abnormal parenchymal attenuation. There is no
evidence of a transcortical infarct.

There are no extra-axial masses or abnormal fluid collections.

There is no intracranial hemorrhage.

The visualized sinuses and mastoid air cells are clear.
IMPRESSION: Normal unenhanced CT scan of the brain.

## 2014-11-09 IMAGING — US US ABDOMEN COMPLETE
1 series · 14 of 25 positions shown · non-contrast
Comparison: None.

CLINICAL DATA: Abdominal pain. Flank pain.

EXAM:
ABDOMEN ULTRASOUND

[Series 1: us abdomen complete · 0.20mm/px · 14 of 29 slices shown]
[im 1/29]
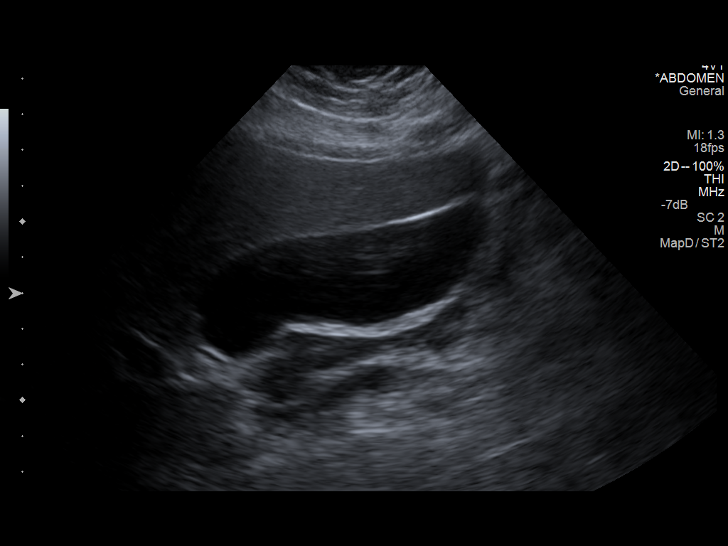
[im 3/29]
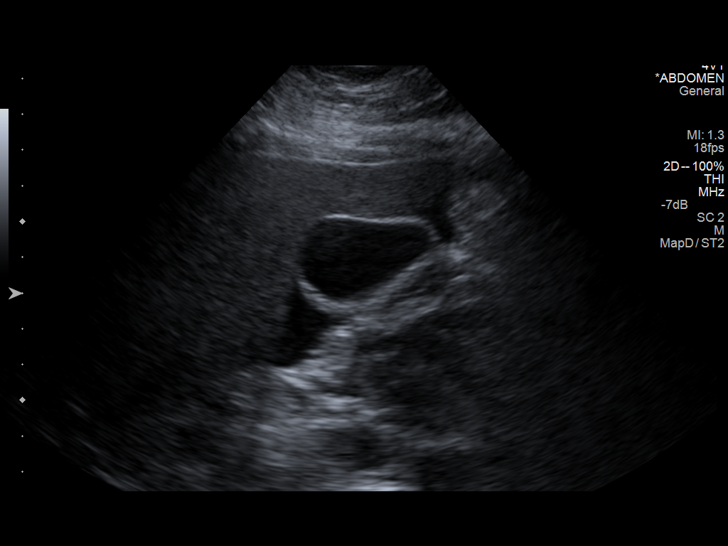
[im 5/29]
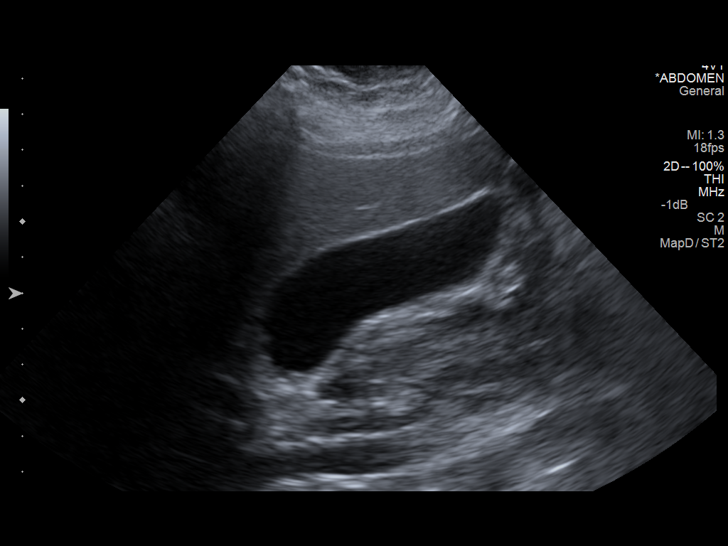
[im 8/29]
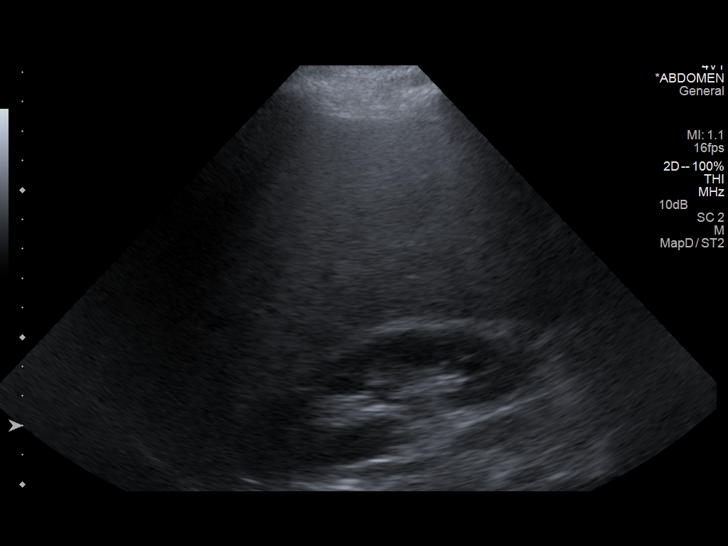
[im 10/29]
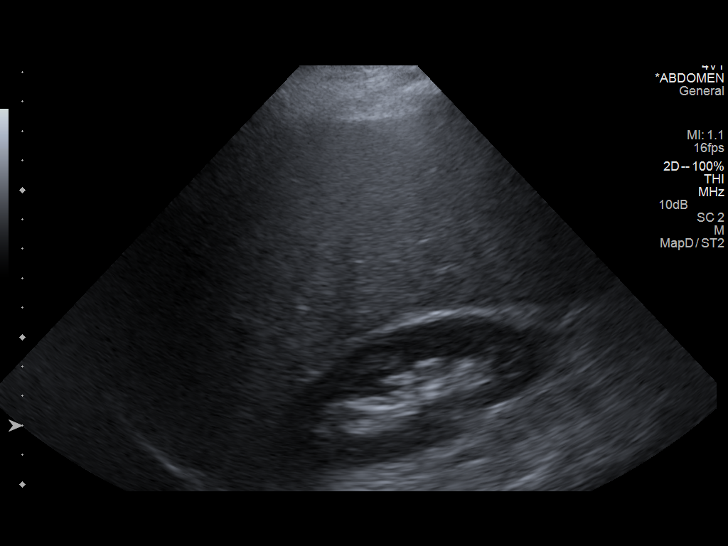
[im 11/29]
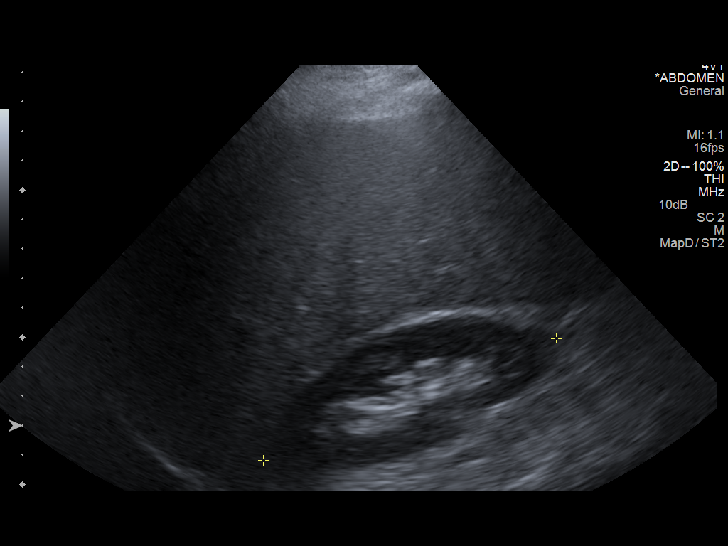
[im 13/29]
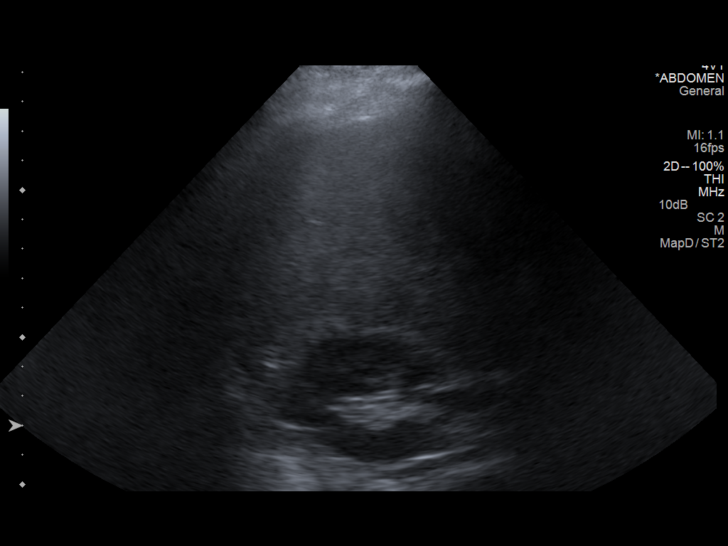
[im 16/29]
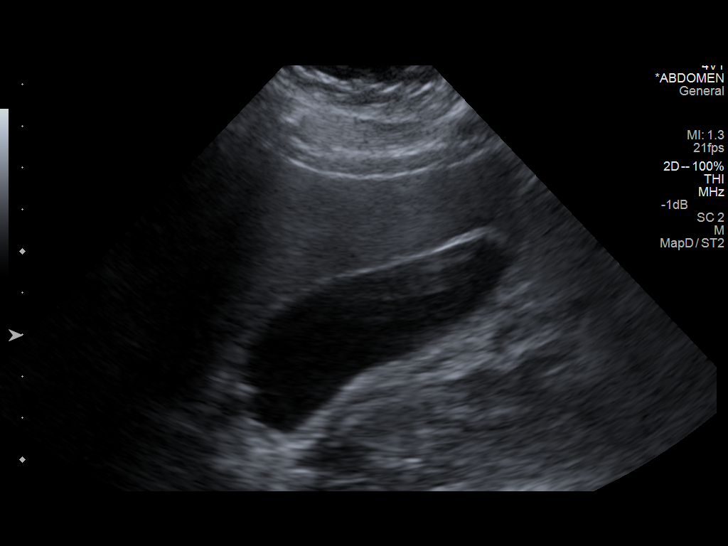
[im 18/29]
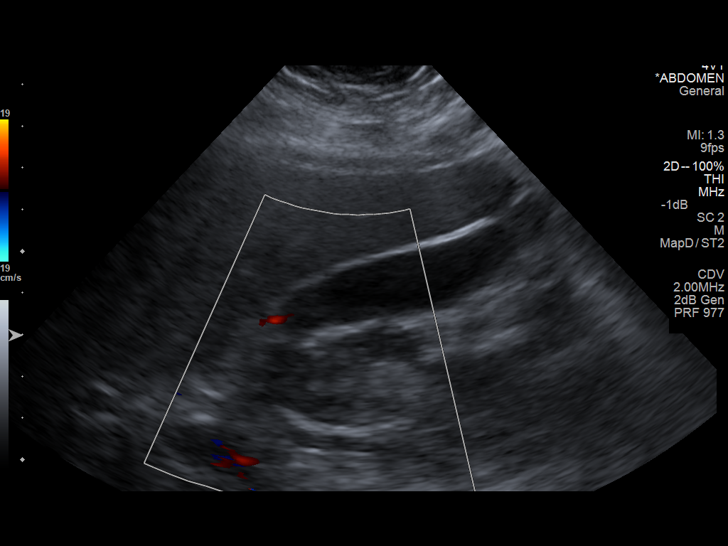
[im 19/29]
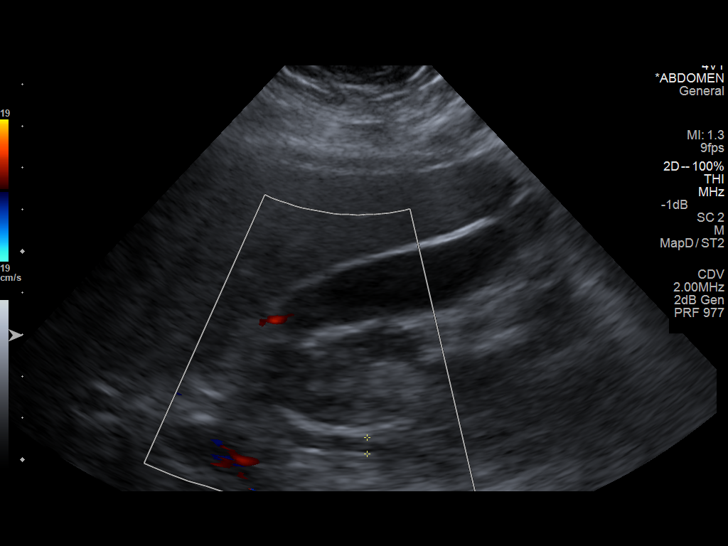
[im 22/29]
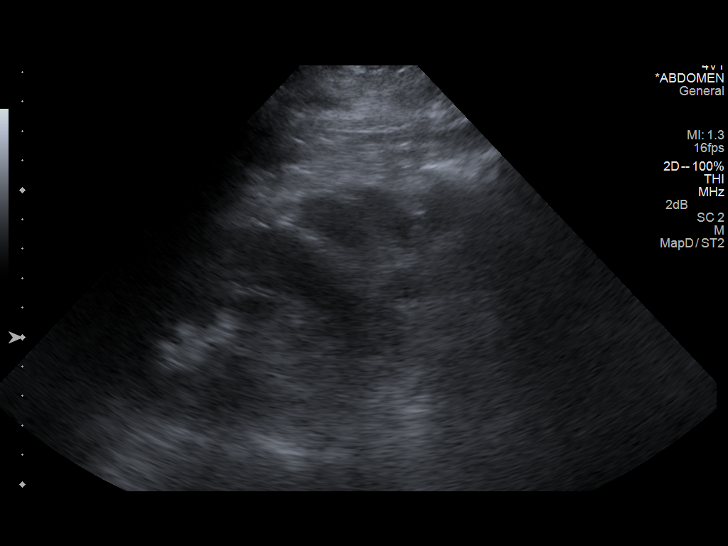
[im 24/29]
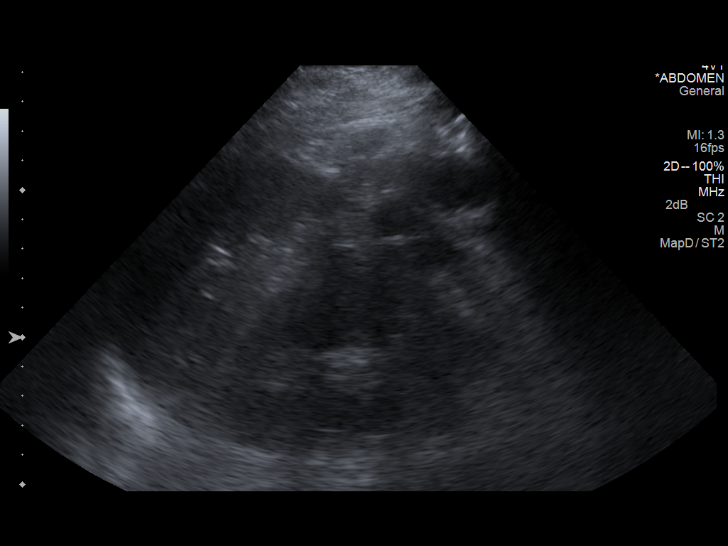
[im 26/29]
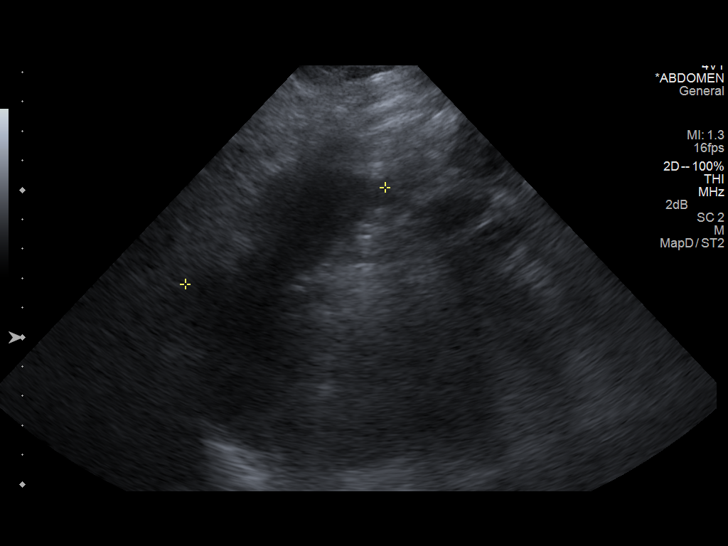
[im 29/29]
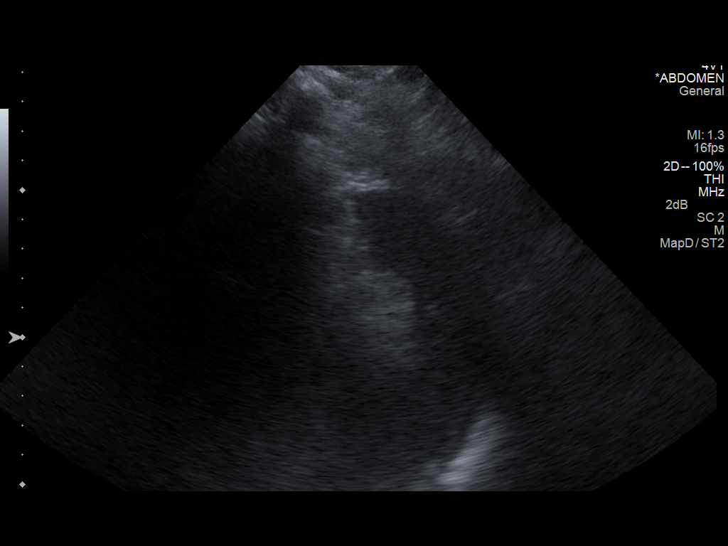

[14 of 25 positions shown; findings below may reference images not displayed]

FINDINGS: Gallbladder

No gallstones or wall thickening. Negative sonographic Murphy's
sign.

Common bile duct

Diameter: Upper normal, 6 mm

Liver

Increased echogenicity. Suspicion of hepatomegaly, greater than
cm.

IVC

No abnormality visualized.

Pancreas

Obscured by overlying bowel gas.

Spleen

Size and appearance within normal limits.

Right Kidney

Length: 10.8 cm.  No hydronephrosis.

Left Kidney

Length: 9.9 cm. Obscured by overlying bowel gas.

Abdominal aorta

Not well visualized. No aneurysm identified. Small volume
perihepatic fluid identified.

EXAM degraded by seizure, which occurred during the attempted
evaluation.
IMPRESSION: 1. Degraded exam, secondary to seizure and patient clinical status.
2. Hepatic steatosis and hepatomegaly.
3. Small volume nonspecific perihepatic ascites.

## 2015-01-25 ENCOUNTER — Encounter (HOSPITAL_COMMUNITY): Payer: Self-pay | Admitting: *Deleted

## 2015-01-25 ENCOUNTER — Emergency Department (HOSPITAL_COMMUNITY)
Admission: EM | Admit: 2015-01-25 | Discharge: 2015-01-25 | Discharge status: Against Medical Advice | Payer: Self-pay | Attending: Emergency Medicine | Admitting: Emergency Medicine

## 2015-01-25 DIAGNOSIS — J45909 Unspecified asthma, uncomplicated: Secondary | ICD-10-CM | POA: Insufficient documentation

## 2015-01-25 DIAGNOSIS — I10 Essential (primary) hypertension: Secondary | ICD-10-CM | POA: Insufficient documentation

## 2015-01-25 DIAGNOSIS — Z720 Tobacco use: Secondary | ICD-10-CM | POA: Insufficient documentation

## 2015-01-25 DIAGNOSIS — Z202 Contact with and (suspected) exposure to infections with a predominantly sexual mode of transmission: Secondary | ICD-10-CM | POA: Insufficient documentation
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
## 2015-12-13 ENCOUNTER — Encounter: Payer: Self-pay | Admitting: *Deleted

## 2021-07-17 ENCOUNTER — Ambulatory Visit: Payer: Self-pay
